# Patient Record
Sex: Female | Born: 1935 | Race: White | Hispanic: No | Marital: Single | Smoking: Never smoker
Health system: Southern US, Community
[De-identification: ages and names within clinical notes are randomized; demographics above are authoritative.]

## PROBLEM LIST (undated history)

## (undated) DIAGNOSIS — K2270 Barrett's esophagus without dysplasia: Secondary | ICD-10-CM

## (undated) DIAGNOSIS — L905 Scar conditions and fibrosis of skin: Secondary | ICD-10-CM

## (undated) DIAGNOSIS — I10 Essential (primary) hypertension: Secondary | ICD-10-CM

## (undated) DIAGNOSIS — K219 Gastro-esophageal reflux disease without esophagitis: Secondary | ICD-10-CM

## (undated) DIAGNOSIS — N200 Calculus of kidney: Secondary | ICD-10-CM

## (undated) DIAGNOSIS — I639 Cerebral infarction, unspecified: Secondary | ICD-10-CM

## (undated) DIAGNOSIS — M1990 Unspecified osteoarthritis, unspecified site: Secondary | ICD-10-CM

## (undated) DIAGNOSIS — D649 Anemia, unspecified: Secondary | ICD-10-CM

## (undated) HISTORY — PX: OTHER SURGICAL HISTORY: SHX169

---

## 2012-10-31 ENCOUNTER — Other Ambulatory Visit (HOSPITAL_COMMUNITY): Payer: Self-pay | Admitting: Orthopaedic Surgery

## 2012-11-04 ENCOUNTER — Other Ambulatory Visit (HOSPITAL_COMMUNITY): Payer: Self-pay | Admitting: Orthopaedic Surgery

## 2012-11-14 ENCOUNTER — Encounter (HOSPITAL_COMMUNITY): Payer: Self-pay | Admitting: Pharmacy Technician

## 2012-11-15 ENCOUNTER — Encounter (HOSPITAL_COMMUNITY)
Admission: RE | Admit: 2012-11-15 | Discharge: 2012-11-15 | Disposition: A | Discharge status: Home / Self Care | Payer: Medicare Other | Source: Ambulatory Visit | Attending: Orthopaedic Surgery | Admitting: Orthopaedic Surgery

## 2012-11-15 ENCOUNTER — Ambulatory Visit (HOSPITAL_COMMUNITY)
Admission: RE | Admit: 2012-11-15 | Discharge: 2012-11-15 | Disposition: A | Discharge status: Home / Self Care | Payer: Medicare Other | Source: Ambulatory Visit | Attending: Orthopaedic Surgery | Admitting: Orthopaedic Surgery

## 2012-11-15 ENCOUNTER — Encounter (HOSPITAL_COMMUNITY): Payer: Self-pay

## 2012-11-15 DIAGNOSIS — Z01812 Encounter for preprocedural laboratory examination: Secondary | ICD-10-CM | POA: Insufficient documentation

## 2012-11-15 DIAGNOSIS — Z01818 Encounter for other preprocedural examination: Secondary | ICD-10-CM | POA: Insufficient documentation

## 2012-11-15 DIAGNOSIS — K449 Diaphragmatic hernia without obstruction or gangrene: Secondary | ICD-10-CM | POA: Insufficient documentation

## 2012-11-15 DIAGNOSIS — S2000XA Contusion of breast, unspecified breast, initial encounter: Secondary | ICD-10-CM | POA: Insufficient documentation

## 2012-11-15 DIAGNOSIS — R0602 Shortness of breath: Secondary | ICD-10-CM | POA: Insufficient documentation

## 2012-11-15 HISTORY — DX: Essential (primary) hypertension: I10

## 2012-11-15 HISTORY — DX: Barrett's esophagus without dysplasia: K22.70

## 2012-11-15 HISTORY — DX: Gastro-esophageal reflux disease without esophagitis: K21.9

## 2012-11-15 HISTORY — DX: Anemia, unspecified: D64.9

## 2012-11-15 HISTORY — DX: Cerebral infarction, unspecified: I63.9

## 2012-11-15 HISTORY — DX: Scar conditions and fibrosis of skin: L90.5

## 2012-11-15 HISTORY — DX: Unspecified osteoarthritis, unspecified site: M19.90

## 2012-11-15 LAB — APTT: aPTT: 29 seconds (ref 24–37)

## 2012-11-15 NOTE — Pre-Procedure Instructions (Signed)
EKG, CMET, CBC, DIFF, URINALYSIS REPORTS ON PT'S CHART FROM DR. MOscar La DONE 11/08/2012.  CXR, PT, PTT, T/S WERE DONE TODAY - PREOP - AT [facility].

## 2012-11-15 NOTE — Patient Instructions (Signed)
YOUR SURGERY IS SCHEDULED AT [facility]  ON:  Friday  4/4  REPORT TO [place] SHORT STAY CENTER AT:  7:30 AM      PHONE # FOR SHORT STAY IS [phone]  DO NOT EAT OR DRINK ANYTHING AFTER MIDNIGHT THE NIGHT BEFORE YOUR SURGERY.  YOU MAY BRUSH YOUR TEETH, RINSE OUT YOUR MOUTH--BUT NO WATER, NO FOOD, NO CHEWING GUM, NO MINTS, NO CANDIES, NO CHEWING TOBACCO.  PLEASE TAKE THE FOLLOWING MEDICATIONS THE AM OF YOUR SURGERY WITH A FEW SIPS OF WATER: AMLODIPINE, LIPITOR, PRILOSEC, AND TAKE PAIN MEDICATION TYLENOL # 3 IF NEEDED FOR PAIN.    DO NOT BRING VALUABLES, MONEY, CREDIT CARDS.  DO NOT WEAR JEWELRY, MAKE-UP, NAIL POLISH AND NO METAL PINS OR CLIPS IN YOUR HAIR. CONTACT LENS, DENTURES / PARTIALS, GLASSES SHOULD NOT BE WORN TO SURGERY AND IN MOST CASES-HEARING AIDS WILL NEED TO BE REMOVED.  BRING YOUR GLASSES CASE, ANY EQUIPMENT NEEDED FOR YOUR CONTACT LENS. FOR PATIENTS ADMITTED TO THE HOSPITAL--CHECK OUT TIME THE DAY OF DISCHARGE IS 11:00 AM.  ALL INPATIENT ROOMS ARE PRIVATE - WITH BATHROOM, TELEPHONE, TELEVISION AND WIFI INTERNET.                                PLEASE READ OVER ANY  FACT SHEETS THAT YOU WERE GIVEN: MRSA INFORMATION, BLOOD TRANSFUSION INFORMATION FAILURE TO FOLLOW THESE INSTRUCTIONS MAY RESULT IN THE CANCELLATION OF YOUR SURGERY.   PATIENT SIGNATURE_________________________________

## 2012-11-25 ENCOUNTER — Inpatient Hospital Stay (HOSPITAL_COMMUNITY): Payer: Medicare Other | Admitting: Anesthesiology

## 2012-11-25 ENCOUNTER — Encounter (HOSPITAL_COMMUNITY): Payer: Self-pay | Admitting: Anesthesiology

## 2012-11-25 ENCOUNTER — Inpatient Hospital Stay (HOSPITAL_COMMUNITY)
Admission: RE | Admit: 2012-11-25 | Discharge: 2012-11-29 | DRG: 470 | Disposition: A | Discharge status: Skilled Nursing Facility | Payer: Medicare Other | Source: Ambulatory Visit | Attending: Orthopaedic Surgery | Admitting: Orthopaedic Surgery

## 2012-11-25 ENCOUNTER — Inpatient Hospital Stay (HOSPITAL_COMMUNITY): Payer: Medicare Other

## 2012-11-25 ENCOUNTER — Encounter (HOSPITAL_COMMUNITY)
Admission: RE | Disposition: A | Discharge status: Skilled Nursing Facility | Payer: Self-pay | Source: Ambulatory Visit | Attending: Orthopaedic Surgery

## 2012-11-25 ENCOUNTER — Encounter (HOSPITAL_COMMUNITY): Payer: Self-pay | Admitting: *Deleted

## 2012-11-25 DIAGNOSIS — D62 Acute posthemorrhagic anemia: Secondary | ICD-10-CM | POA: Diagnosis not present

## 2012-11-25 DIAGNOSIS — M1610 Unilateral primary osteoarthritis, unspecified hip: Principal | ICD-10-CM | POA: Diagnosis present

## 2012-11-25 DIAGNOSIS — K219 Gastro-esophageal reflux disease without esophagitis: Secondary | ICD-10-CM | POA: Diagnosis present

## 2012-11-25 DIAGNOSIS — I10 Essential (primary) hypertension: Secondary | ICD-10-CM | POA: Diagnosis present

## 2012-11-25 DIAGNOSIS — R5381 Other malaise: Secondary | ICD-10-CM | POA: Diagnosis present

## 2012-11-25 DIAGNOSIS — M169 Osteoarthritis of hip, unspecified: Secondary | ICD-10-CM

## 2012-11-25 HISTORY — PX: TOTAL HIP ARTHROPLASTY: SHX124

## 2012-11-25 SURGERY — ARTHROPLASTY, HIP, TOTAL, ANTERIOR APPROACH
Anesthesia: General | Site: Hip | Laterality: Right | Wound class: Clean

## 2012-11-25 MED ORDER — METHOCARBAMOL 500 MG PO TABS
500.0000 mg | ORAL_TABLET | Freq: Four times a day (QID) | ORAL | Status: DC | PRN
  Administered 2012-11-26 – 2012-11-29 (×11): 500 mg via ORAL
  Filled 2012-11-25 (×10): qty 1

## 2012-11-25 MED ORDER — ALUM & MAG HYDROXIDE-SIMETH 200-200-20 MG/5ML PO SUSP: 30.0000 mL | ORAL | Status: DC | PRN

## 2012-11-25 MED ORDER — HYDROMORPHONE HCL PF 1 MG/ML IJ SOLN
INTRAMUSCULAR | Status: DC | PRN
  Administered 2012-11-25 (×2): 1 mg via INTRAVENOUS

## 2012-11-25 MED ORDER — SODIUM CHLORIDE 0.9 % IR SOLN
Status: DC | PRN
  Administered 2012-11-25: 1000 mL

## 2012-11-25 MED ORDER — PROMETHAZINE HCL 25 MG/ML IJ SOLN: 6.2500 mg | INTRAMUSCULAR | Status: DC | PRN

## 2012-11-25 MED ORDER — HYDROCHLOROTHIAZIDE 25 MG PO TABS
25.0000 mg | ORAL_TABLET | Freq: Every day | ORAL | Status: DC
  Administered 2012-11-26 – 2012-11-29 (×3): 25 mg via ORAL
  Filled 2012-11-25 (×4): qty 1

## 2012-11-25 MED ORDER — PROPOFOL 10 MG/ML IV BOLUS
INTRAVENOUS | Status: DC | PRN
  Administered 2012-11-25: 150 mg via INTRAVENOUS

## 2012-11-25 MED ORDER — OXYCODONE HCL ER 10 MG PO T12A
10.0000 mg | EXTENDED_RELEASE_TABLET | Freq: Two times a day (BID) | ORAL | Status: DC
  Administered 2012-11-25 – 2012-11-29 (×8): 10 mg via ORAL
  Filled 2012-11-25 (×8): qty 1

## 2012-11-25 MED ORDER — ACETAMINOPHEN 650 MG RE SUPP: 650.0000 mg | Freq: Four times a day (QID) | RECTAL | Status: DC | PRN

## 2012-11-25 MED ORDER — IRBESARTAN 300 MG PO TABS
300.0000 mg | ORAL_TABLET | Freq: Every day | ORAL | Status: DC
  Administered 2012-11-26 – 2012-11-29 (×3): 300 mg via ORAL
  Filled 2012-11-25 (×4): qty 1

## 2012-11-25 MED ORDER — ONDANSETRON HCL 4 MG/2ML IJ SOLN
4.0000 mg | Freq: Four times a day (QID) | INTRAMUSCULAR | Status: DC | PRN
  Administered 2012-11-25: 4 mg via INTRAVENOUS
  Filled 2012-11-25: qty 2

## 2012-11-25 MED ORDER — PHENOL 1.4 % MT LIQD: 1.0000 | OROMUCOSAL | Status: DC | PRN

## 2012-11-25 MED ORDER — ACETAMINOPHEN 10 MG/ML IV SOLN
INTRAVENOUS | Status: DC | PRN
  Administered 2012-11-25: 1000 mg via INTRAVENOUS

## 2012-11-25 MED ORDER — EZETIMIBE 10 MG PO TABS
10.0000 mg | ORAL_TABLET | Freq: Every day | ORAL | Status: DC
  Administered 2012-11-25 – 2012-11-28 (×4): 10 mg via ORAL
  Filled 2012-11-25 (×5): qty 1

## 2012-11-25 MED ORDER — CLOPIDOGREL BISULFATE 75 MG PO TABS
75.0000 mg | ORAL_TABLET | Freq: Every day | ORAL | Status: DC
  Administered 2012-11-26 – 2012-11-29 (×4): 75 mg via ORAL
  Filled 2012-11-25 (×6): qty 1

## 2012-11-25 MED ORDER — LACTATED RINGERS IV SOLN: INTRAVENOUS | Status: DC

## 2012-11-25 MED ORDER — 0.9 % SODIUM CHLORIDE (POUR BTL) OPTIME
TOPICAL | Status: DC | PRN
  Administered 2012-11-25: 1000 mL

## 2012-11-25 MED ORDER — ACETAMINOPHEN 325 MG PO TABS
650.0000 mg | ORAL_TABLET | Freq: Four times a day (QID) | ORAL | Status: DC | PRN
  Administered 2012-11-26: 650 mg via ORAL
  Filled 2012-11-25 (×2): qty 2

## 2012-11-25 MED ORDER — ASPIRIN EC 325 MG PO TBEC
325.0000 mg | DELAYED_RELEASE_TABLET | Freq: Every day | ORAL | Status: DC
  Administered 2012-11-26 – 2012-11-29 (×4): 325 mg via ORAL
  Filled 2012-11-25 (×5): qty 1

## 2012-11-25 MED ORDER — HYDROMORPHONE HCL PF 1 MG/ML IJ SOLN
0.5000 mg | INTRAMUSCULAR | Status: DC | PRN
  Administered 2012-11-26 (×2): 0.5 mg via INTRAVENOUS
  Filled 2012-11-25 (×2): qty 1

## 2012-11-25 MED ORDER — AMLODIPINE BESYLATE 10 MG PO TABS
10.0000 mg | ORAL_TABLET | Freq: Every day | ORAL | Status: DC
  Administered 2012-11-26 – 2012-11-29 (×3): 10 mg via ORAL
  Filled 2012-11-25 (×5): qty 1

## 2012-11-25 MED ORDER — DIPHENHYDRAMINE HCL 12.5 MG/5ML PO ELIX: 12.5000 mg | ORAL_SOLUTION | ORAL | Status: DC | PRN

## 2012-11-25 MED ORDER — FENTANYL CITRATE 0.05 MG/ML IJ SOLN
25.0000 ug | INTRAMUSCULAR | Status: DC | PRN
  Administered 2012-11-25: 25 ug via INTRAVENOUS

## 2012-11-25 MED ORDER — DOCUSATE SODIUM 100 MG PO CAPS
100.0000 mg | ORAL_CAPSULE | Freq: Two times a day (BID) | ORAL | Status: DC
  Administered 2012-11-26 – 2012-11-29 (×7): 100 mg via ORAL

## 2012-11-25 MED ORDER — METHOCARBAMOL 100 MG/ML IJ SOLN: 500.0000 mg | Freq: Four times a day (QID) | INTRAVENOUS | Status: DC | PRN

## 2012-11-25 MED ORDER — ROCURONIUM BROMIDE 100 MG/10ML IV SOLN
INTRAVENOUS | Status: DC | PRN
  Administered 2012-11-25: 20 mg via INTRAVENOUS

## 2012-11-25 MED ORDER — MENTHOL 3 MG MT LOZG
1.0000 | LOZENGE | OROMUCOSAL | Status: DC | PRN
  Administered 2012-11-26: 3 mg via ORAL
  Filled 2012-11-25: qty 9

## 2012-11-25 MED ORDER — GLYCOPYRROLATE 0.2 MG/ML IJ SOLN
INTRAMUSCULAR | Status: DC | PRN
  Administered 2012-11-25: .8 mg via INTRAVENOUS

## 2012-11-25 MED ORDER — CEFAZOLIN SODIUM-DEXTROSE 2-3 GM-% IV SOLR
2.0000 g | INTRAVENOUS | Status: AC
  Administered 2012-11-25: 2 g via INTRAVENOUS

## 2012-11-25 MED ORDER — FENOFIBRATE 160 MG PO TABS
160.0000 mg | ORAL_TABLET | Freq: Every evening | ORAL | Status: DC
  Administered 2012-11-26 – 2012-11-28 (×3): 160 mg via ORAL
  Filled 2012-11-25 (×5): qty 1

## 2012-11-25 MED ORDER — CEFAZOLIN SODIUM 1-5 GM-% IV SOLN
1.0000 g | Freq: Four times a day (QID) | INTRAVENOUS | Status: AC
  Administered 2012-11-25 – 2012-11-26 (×2): 1 g via INTRAVENOUS
  Filled 2012-11-25 (×2): qty 50

## 2012-11-25 MED ORDER — DEXAMETHASONE SODIUM PHOSPHATE 4 MG/ML IJ SOLN
INTRAMUSCULAR | Status: DC | PRN
  Administered 2012-11-25: 10 mg via INTRAVENOUS

## 2012-11-25 MED ORDER — ONDANSETRON HCL 4 MG PO TABS: 4.0000 mg | ORAL_TABLET | Freq: Four times a day (QID) | ORAL | Status: DC | PRN

## 2012-11-25 MED ORDER — FENTANYL CITRATE 0.05 MG/ML IJ SOLN
INTRAMUSCULAR | Status: DC | PRN
  Administered 2012-11-25 (×2): 50 ug via INTRAVENOUS
  Administered 2012-11-25: 25 ug via INTRAVENOUS
  Administered 2012-11-25 (×2): 50 ug via INTRAVENOUS
  Administered 2012-11-25: 25 ug via INTRAVENOUS
  Administered 2012-11-25: 100 ug via INTRAVENOUS
  Administered 2012-11-25: 50 ug via INTRAVENOUS
  Administered 2012-11-25 (×3): 25 ug via INTRAVENOUS
  Administered 2012-11-25: 75 ug via INTRAVENOUS

## 2012-11-25 MED ORDER — PANTOPRAZOLE SODIUM 40 MG PO TBEC: 40.0000 mg | DELAYED_RELEASE_TABLET | Freq: Every day | ORAL | Status: DC | PRN

## 2012-11-25 MED ORDER — OXYCODONE HCL 5 MG PO TABS
5.0000 mg | ORAL_TABLET | ORAL | Status: DC | PRN
  Administered 2012-11-26 (×2): 10 mg via ORAL
  Administered 2012-11-26: 5 mg via ORAL
  Administered 2012-11-26 – 2012-11-27 (×2): 10 mg via ORAL
  Administered 2012-11-28 – 2012-11-29 (×3): 5 mg via ORAL
  Filled 2012-11-25: qty 1
  Filled 2012-11-25: qty 2
  Filled 2012-11-25 (×2): qty 1
  Filled 2012-11-25 (×3): qty 2
  Filled 2012-11-25: qty 1

## 2012-11-25 MED ORDER — OMEPRAZOLE MAGNESIUM 20 MG PO TBEC: 20.0000 mg | DELAYED_RELEASE_TABLET | ORAL | Status: DC | PRN

## 2012-11-25 MED ORDER — TERAZOSIN HCL 2 MG PO CAPS
2.0000 mg | ORAL_CAPSULE | Freq: Every day | ORAL | Status: DC
  Administered 2012-11-25 – 2012-11-28 (×3): 2 mg via ORAL
  Filled 2012-11-25 (×5): qty 1

## 2012-11-25 MED ORDER — ZOLPIDEM TARTRATE 5 MG PO TABS: 5.0000 mg | ORAL_TABLET | Freq: Every evening | ORAL | Status: DC | PRN

## 2012-11-25 MED ORDER — NEOSTIGMINE METHYLSULFATE 1 MG/ML IJ SOLN
INTRAMUSCULAR | Status: DC | PRN
  Administered 2012-11-25: 5 mg via INTRAVENOUS

## 2012-11-25 MED ORDER — LACTATED RINGERS IV SOLN
INTRAVENOUS | Status: DC
  Administered 2012-11-25 (×2): via INTRAVENOUS
  Administered 2012-11-25: 1000 mL via INTRAVENOUS

## 2012-11-25 MED ORDER — METOCLOPRAMIDE HCL 10 MG PO TABS: 5.0000 mg | ORAL_TABLET | Freq: Three times a day (TID) | ORAL | Status: DC | PRN

## 2012-11-25 MED ORDER — ATORVASTATIN CALCIUM 10 MG PO TABS
10.0000 mg | ORAL_TABLET | Freq: Every day | ORAL | Status: DC
  Administered 2012-11-26 – 2012-11-29 (×4): 10 mg via ORAL
  Filled 2012-11-25 (×5): qty 1

## 2012-11-25 MED ORDER — SODIUM CHLORIDE 0.9 % IV SOLN
INTRAVENOUS | Status: DC
  Administered 2012-11-25 – 2012-11-26 (×2): via INTRAVENOUS

## 2012-11-25 MED ORDER — FERROUS FUMARATE 325 (106 FE) MG PO TABS
1.0000 | ORAL_TABLET | Freq: Every day | ORAL | Status: DC
  Administered 2012-11-26 – 2012-11-29 (×4): 106 mg via ORAL
  Filled 2012-11-25 (×5): qty 1

## 2012-11-25 MED ORDER — METOCLOPRAMIDE HCL 5 MG/ML IJ SOLN: 5.0000 mg | Freq: Three times a day (TID) | INTRAMUSCULAR | Status: DC | PRN

## 2012-11-25 MED ORDER — OLMESARTAN MEDOXOMIL-HCTZ 40-25 MG PO TABS: 1.0000 | ORAL_TABLET | Freq: Every day | ORAL | Status: DC

## 2012-11-25 MED ORDER — METOCLOPRAMIDE HCL 5 MG/ML IJ SOLN
INTRAMUSCULAR | Status: DC | PRN
  Administered 2012-11-25: 10 mg via INTRAVENOUS

## 2012-11-25 MED ORDER — VITAMIN D (ERGOCALCIFEROL) 1.25 MG (50000 UNIT) PO CAPS
50000.0000 [IU] | ORAL_CAPSULE | ORAL | Status: DC
  Administered 2012-11-28: 50000 [IU] via ORAL
  Filled 2012-11-25 (×2): qty 1

## 2012-11-25 SURGICAL SUPPLY — 35 items
BAG ZIPLOCK 12X15 (MISCELLANEOUS) ×2 IMPLANT
BLADE SAW SGTL 18X1.27X75 (BLADE) ×2 IMPLANT
CELLS DAT CNTRL 66122 CELL SVR (MISCELLANEOUS) ×1 IMPLANT
CLOTH BEACON ORANGE TIMEOUT ST (SAFETY) ×2 IMPLANT
DERMABOND ADVANCED (GAUZE/BANDAGES/DRESSINGS) ×1
DERMABOND ADVANCED .7 DNX12 (GAUZE/BANDAGES/DRESSINGS) ×1 IMPLANT
DRAPE C-ARM 42X72 X-RAY (DRAPES) ×2 IMPLANT
DRAPE STERI IOBAN 125X83 (DRAPES) ×2 IMPLANT
DRAPE U-SHAPE 47X51 STRL (DRAPES) ×6 IMPLANT
DRSG AQUACEL AG ADV 3.5X10 (GAUZE/BANDAGES/DRESSINGS) ×2 IMPLANT
DURAPREP 26ML APPLICATOR (WOUND CARE) ×2 IMPLANT
ELECT BLADE TIP CTD 4 INCH (ELECTRODE) ×2 IMPLANT
ELECT REM PT RETURN 9FT ADLT (ELECTROSURGICAL) ×2
ELECTRODE REM PT RTRN 9FT ADLT (ELECTROSURGICAL) ×1 IMPLANT
FACESHIELD LNG OPTICON STERILE (SAFETY) ×8 IMPLANT
GLOVE BIO SURGEON STRL SZ7.5 (GLOVE) ×2 IMPLANT
GLOVE BIOGEL PI IND STRL 8 (GLOVE) ×2 IMPLANT
GLOVE BIOGEL PI INDICATOR 8 (GLOVE) ×2
GLOVE ECLIPSE 8.0 STRL XLNG CF (GLOVE) ×2 IMPLANT
GOWN STRL REIN XL XLG (GOWN DISPOSABLE) ×4 IMPLANT
HANDPIECE INTERPULSE COAX TIP (DISPOSABLE) ×1
KIT BASIN OR (CUSTOM PROCEDURE TRAY) ×2 IMPLANT
PACK TOTAL JOINT (CUSTOM PROCEDURE TRAY) ×2 IMPLANT
PADDING CAST COTTON 6X4 STRL (CAST SUPPLIES) ×2 IMPLANT
RTRCTR WOUND ALEXIS 18CM MED (MISCELLANEOUS) ×2
SET HNDPC FAN SPRY TIP SCT (DISPOSABLE) ×1 IMPLANT
SUT ETHIBOND NAB CT1 #1 30IN (SUTURE) ×2 IMPLANT
SUT MNCRL AB 4-0 PS2 18 (SUTURE) ×2 IMPLANT
SUT NYLON 3 0 (SUTURE) ×2 IMPLANT
SUT VIC AB 1 CT1 36 (SUTURE) ×2 IMPLANT
SUT VIC AB 2-0 CT1 27 (SUTURE) ×1
SUT VIC AB 2-0 CT1 TAPERPNT 27 (SUTURE) ×1 IMPLANT
TOWEL OR 17X26 10 PK STRL BLUE (TOWEL DISPOSABLE) ×2 IMPLANT
TOWEL OR NON WOVEN STRL DISP B (DISPOSABLE) ×2 IMPLANT
TRAY FOLEY CATH 14FRSI W/METER (CATHETERS) ×2 IMPLANT

## 2012-11-25 NOTE — Anesthesia Postprocedure Evaluation (Signed)
Anesthesia Post Note  Patient: Brenda Schmidt  Procedure(s) Performed: Procedure(s) (LRB): RIGHT TOTAL HIP ARTHROPLASTY ANTERIOR APPROACH (Right)  Anesthesia type: General  Patient location: PACU  Post pain: Pain level controlled  Post assessment: Post-op Vital signs reviewed  Last Vitals:  Filed Vitals:   11/25/12 1330  BP: 146/52  Pulse: 50  Temp: 36.8 C  Resp: 13    Post vital signs: Reviewed  Level of consciousness: sedated  Complications: No apparent anesthesia complications

## 2012-11-25 NOTE — Care Management Note (Addendum)
    Page 1 of 1   11/28/2012     1:48:40 PM   CARE MANAGEMENT NOTE 11/28/2012  Patient:  WANNETTA, LANGLAND   Account Number:  [id number]  Date Initiated:  11/25/2012  Documentation initiated by:  Colleen Can  Subjective/Objective Assessment:   DX TOTAL HIP REPLACEMNT RIGHT-ANTERIOR APPROACH     Action/Plan:   SNF REHAB   Anticipated DC Date:  11/28/2012   Anticipated DC Plan:  SKILLED NURSING FACILITY  In-house referral  Clinical Social Worker      DC Planning Services  CM consult      Choice offered to / List presented to:             Status of service:  Completed, signed off Medicare Important Message given?   (If response is "NO", the following Medicare IM given date fields will be blank) Date Medicare IM given:   Date Additional Medicare IM given:    Discharge Disposition:    Per UR Regulation:  Reviewed for med. necessity/level of care/duration of stay  If discussed at Long Length of Stay Meetings, dates discussed:    Comments:
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
## 2012-11-25 NOTE — Progress Notes (Signed)
Clinical Social Work Department BRIEF PSYCHOSOCIAL ASSESSMENT 11/25/2012  Patient:  Brenda Schmidt, Brenda Schmidt     Account Number:  [id number]     Admit date:  11/25/2012  Clinical Social Worker:  Candie Chroman  Date/Time:  11/25/2012 03:49 PM  Referred by:  Physician  Date Referred:  11/25/2012 Referred for  SNF Placement   Other Referral:   Interview type:  Patient Other interview type:    PSYCHOSOCIAL DATA Living Status:  ALONE Admitted from facility:   Level of care:   Primary support name:  Donn Pierini Primary support relationship to patient:  SIBLING Degree of support available:   supportive    CURRENT CONCERNS Current Concerns  Post-Acute Placement   Other Concerns:    SOCIAL WORK ASSESSMENT / PLAN Pt is an 79 yr old female living at home prior to hospitalization. CSW met with pt / family to assist with d/c planning. Pt has made prior arrangements to have ST Rehab at [facility] following hospital d/c. CSW has contacted SNF and d/c plans have been confirmed. CSW will follow to assist with d/c planning to SNF.   Assessment/plan status:  Psychosocial Support/Ongoing Assessment of Needs Other assessment/ plan:   Information/referral to community resources:   None needed at this time.    PATIENT'S/FAMILY'S RESPONSE TO PLAN OF CARE: Pt looking forward to feeling better and having rehab at [facility].   Cori Razor LCSW [phone]

## 2012-11-25 NOTE — Preoperative (Signed)
Beta Blockers   Reason not to administer Beta Blockers:Not Applicable, not on home BB 

## 2012-11-25 NOTE — Progress Notes (Signed)
PT NOTE - POD 0 eval deferred at request of RN - pt too lethargic to participate.  Will follow in am.

## 2012-11-25 NOTE — Progress Notes (Signed)
Utilization review completed.  

## 2012-11-25 NOTE — Anesthesia Preprocedure Evaluation (Signed)
Anesthesia Evaluation  Patient identified by MRN, date of birth, ID band Patient awake    Reviewed: Allergy & Precautions, H&P , NPO status , Patient's Chart, lab work & pertinent test results  Airway Mallampati: II TM Distance: >3 FB Neck ROM: Full    Dental  (+) Edentulous Upper and Edentulous Lower   Pulmonary neg pulmonary ROS,  breath sounds clear to auscultation  Pulmonary exam normal       Cardiovascular hypertension, Pt. on medications negative cardio ROS  Rhythm:Regular Rate:Normal     Neuro/Psych CVA, Residual Symptoms negative neurological ROS  negative psych ROS   GI/Hepatic negative GI ROS, Neg liver ROS, GERD-  ,  Endo/Other  negative endocrine ROS  Renal/GU negative Renal ROS  negative genitourinary   Musculoskeletal negative musculoskeletal ROS (+)   Abdominal   Peds  Hematology negative hematology ROS (+)   Anesthesia Other Findings   Reproductive/Obstetrics                           Anesthesia Physical Anesthesia Plan  ASA: III  Anesthesia Plan: General   Post-op Pain Management:    Induction: Intravenous  Airway Management Planned: Oral ETT  Additional Equipment:   Intra-op Plan:   Post-operative Plan: Extubation in OR  Informed Consent: I have reviewed the patients History and Physical, chart, labs and discussed the procedure including the risks, benefits and alternatives for the proposed anesthesia with the patient or authorized representative who has indicated his/her understanding and acceptance.   Dental advisory given  Plan Discussed with: CRNA  Anesthesia Plan Comments:         Anesthesia Quick Evaluation

## 2012-11-25 NOTE — Progress Notes (Signed)
Clinical Social Work Department CLINICAL SOCIAL WORK PLACEMENT NOTE 11/25/2012  Patient:  Brenda Schmidt, Brenda Schmidt  Account Number:  [id number] Admit date:  11/25/2012  Clinical Social Worker:  Cori Razor, LCSW  Date/time:  11/25/2012 04:07 PM  Clinical Social Work is seeking post-discharge placement for this patient at the following level of care:   SKILLED NURSING   (*CSW will update this form in Epic as items are completed)     Patient/family provided with [facility] Department of Clinical Social Work's list of facilities offering this level of care within the geographic area requested by the patient (or if unable, by the patient's family).  11/25/2012  Patient/family informed of their freedom to choose among providers that offer the needed level of care, that participate in Medicare, Medicaid or managed care program needed by the patient, have an available bed and are willing to accept the patient.    Patient/family informed of [facility]' ownership interest in [facility], as well as of the fact that they are under no obligation to receive care at this facility.  PASARR submitted to EDS on 11/25/2012 PASARR number received from [employer]DS on   FL2 transmitted to all facilities in geographic area requested by pt/family on  11/25/2012 FL2 transmitted to all facilities within larger geographic area on   Patient informed that his/her managed care company has contracts with or will negotiate with  certain facilities, including the following:     Patient/family informed of bed offers received:  11/25/2012 Patient chooses bed at [facility] Physician recommends and patient chooses bed at    Patient to be transferred to [facility] on   Patient to be transferred to facility by   The following physician request were entered in Epic:   Additional Comments:  Cori Razor LCSW [phone]

## 2012-11-25 NOTE — Transfer of Care (Signed)
Immediate Anesthesia Transfer of Care Note  Patient: Brenda Schmidt  Procedure(s) Performed: Procedure(s): RIGHT TOTAL HIP ARTHROPLASTY ANTERIOR APPROACH (Right)  Patient Location: PACU  Anesthesia Type:General  Level of Consciousness: awake, patient cooperative and responds to stimulation, drowsy, disoriented,ventilating well   Airway & Oxygen Therapy: Patient Spontanous Breathing and Patient connected to face mask  Post-op Assessment: Report given to PACU RN, Post -op Vital signs reviewed and stable and Patient moving all extremities  Post vital signs: Reviewed and stable  Complications: No apparent anesthesia complications

## 2012-11-25 NOTE — Brief Op Note (Signed)
11/25/2012  12:09 PM  PATIENT:  Kathalene Frames  79 y.o. female  PRE-OPERATIVE DIAGNOSIS:  Severe osteoarthritis right hip  POST-OPERATIVE DIAGNOSIS:  Severe osteoarthritis right hip  PROCEDURE:  Procedure(s): RIGHT TOTAL HIP ARTHROPLASTY ANTERIOR APPROACH (Right)  SURGEON:  Surgeon(s) and Role:    * Kathryne Hitch, MD - Primary  PHYSICIAN ASSISTANT: [name], PA-C  ANESTHESIA:   general  EBL:  Total I/O In: 2000 [I.V.:2000] Out: 530 [Urine:230; Blood:300]  BLOOD ADMINISTERED:none  DRAINS: none   LOCAL MEDICATIONS USED:  NONE  SPECIMEN:  No Specimen  DISPOSITION OF SPECIMEN:  N/A  COUNTS:  YES  TOURNIQUET:  * No tourniquets in log *  DICTATION: .Other Dictation: Dictation Number [id number]  PLAN OF CARE: Admit to inpatient   PATIENT DISPOSITION:  PACU - hemodynamically stable.   Delay start of Pharmacological VTE agent (>24hrs) due to surgical blood loss or risk of bleeding: no

## 2012-11-26 LAB — BASIC METABOLIC PANEL
CO2: 23 mEq/L (ref 19–32)
Chloride: 105 mEq/L (ref 96–112)
Glucose, Bld: 137 mg/dL — ABNORMAL HIGH (ref 70–99)
Sodium: 135 mEq/L (ref 135–145)

## 2012-11-26 LAB — CBC
Hemoglobin: 8 g/dL — ABNORMAL LOW (ref 12.0–15.0)
MCH: 28 pg (ref 26.0–34.0)
Platelets: 282 10*3/uL (ref 150–400)
RBC: 2.86 MIL/uL — ABNORMAL LOW (ref 3.87–5.11)
WBC: 9.9 10*3/uL (ref 4.0–10.5)

## 2012-11-26 NOTE — Evaluation (Signed)
Physical Therapy Evaluation Patient Details Name: Brenda Schmidt MRN: [id number] DOB: 02-05-1929 Today's Date: 11/26/2012 Time: 4098-1191 PT Time Calculation (min): 44 min  PT Assessment / Plan / Recommendation Clinical Impression  Pt s/p R THR presents with decreased R LE strength/ROM, post op pain and SOB with exertion limiting functional mobility    PT Assessment  Patient needs continued PT services    Follow Up Recommendations  SNF    Does the patient have the potential to tolerate intense rehabilitation      Barriers to Discharge Decreased caregiver support      Equipment Recommendations  None recommended by PT    Recommendations for Other Services OT consult   Frequency 7X/week    Precautions / Restrictions Precautions Precautions: Fall;Other (comment) (SOB with exertion) Restrictions Weight Bearing Restrictions: No Other Position/Activity Restrictions: WBAT   Pertinent Vitals/Pain 3/10 at end of session; premedicated, ice packs provided      Mobility  Bed Mobility Bed Mobility: Supine to Sit Supine to Sit: 1: +2 Total assist Supine to Sit: Patient Percentage: 40% Details for Bed Mobility Assistance: cues for use of L LE and UEs to self assist.  Pt assisted to side of bed using pad Transfers Transfers: Sit to Stand;Stand to Sit Sit to Stand: 1: +2 Total assist;With upper extremity assist;From bed;From elevated surface Sit to Stand: Patient Percentage: 60% Stand to Sit: 1: +2 Total assist;With armrests;With upper extremity assist;To chair/3-in-1 Stand to Sit: Patient Percentage: 60% Details for Transfer Assistance: Cues for LE management and use of UEs Ambulation/Gait Ambulation/Gait Assistance: 1: +2 Total assist Ambulation/Gait: Patient Percentage: 70% Ambulation Distance (Feet): 12 Feet Assistive device: Rolling walker Ambulation/Gait Assistance Details: cues for sequence, posture, position from RW and to decrease BOS Gait Pattern: Step-to  pattern;Decreased step length - right;Decreased step length - left;Wide base of support;Trunk flexed Gait velocity: slow General Gait Details: Pt ltd by SOB with exertion and requiring multiple short rests to complete tasks    Exercises Total Joint Exercises Ankle Circles/Pumps: AROM;15 reps;Supine;Both Heel Slides: AAROM;15 reps;Supine;Right Hip ABduction/ADduction: AAROM;15 reps;Supine;Right   PT Diagnosis: Difficulty walking  PT Problem List: Decreased strength;Decreased range of motion;Decreased activity tolerance;Decreased mobility;Decreased balance;Decreased knowledge of use of DME;Pain;Obesity PT Treatment Interventions: DME instruction;Gait training;Stair training;Functional mobility training;Therapeutic activities;Therapeutic exercise   PT Goals Acute Rehab PT Goals PT Goal Formulation: With patient Time For Goal Achievement: 12/02/12 Potential to Achieve Goals: Good Pt will go Supine/Side to Sit: with supervision PT Goal: Supine/Side to Sit - Progress: Goal set today Pt will go Sit to Supine/Side: with supervision PT Goal: Sit to Supine/Side - Progress: Goal set today Pt will go Sit to Stand: with supervision PT Goal: Sit to Stand - Progress: Goal set today Pt will go Stand to Sit: with supervision PT Goal: Stand to Sit - Progress: Goal set today Pt will Ambulate: 51 - 150 feet;with supervision;with rolling walker PT Goal: Ambulate - Progress: Goal set today  Visit Information  Last PT Received On: 11/26/12 Assistance Needed: +2    Subjective Data  Subjective: It just hurts so bad when I try to move it myself Patient Stated Goal: Rehab and then home alone to resume previous lifestyle with decreased pain   Prior Functioning  Home Living Lives With: Alone Prior Function Level of Independence: Independent with assistive device(s) Able to Take Stairs?: Yes Driving: Yes 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[employer] Dominant Hand: Right    Cognition   Cognition Overall Cognitive Status: Appears within functional limits for tasks assessed/performed Arousal/Alertness:  Awake/alert Orientation Level: Appears intact for tasks assessed Behavior During Session: [facility] for tasks performed Cognition - Other Comments: Increased anxiety level    Extremity/Trunk Assessment Right Upper Extremity Assessment RUE ROM/Strength/Tone: WFL for tasks assessed Left Upper Extremity Assessment LUE ROM/Strength/Tone: WFL for tasks assessed Right Lower Extremity Assessment RLE ROM/Strength/Tone: Deficits RLE ROM/Strength/Tone Deficits: Hip strength 2/5 with AAROM to 75 hip flex and 10 abd Left Lower Extremity Assessment LLE ROM/Strength/Tone: WFL for tasks assessed   Balance    End of Session PT - End of Session Equipment Utilized During Treatment: Gait belt Activity Tolerance: Patient limited by fatigue;Patient limited by pain Patient left: in chair;with call bell/phone within reach;with family/visitor present Nurse Communication: Mobility status  GP     Zaina Jenkin 11/26/2012, 1:16 PM

## 2012-11-26 NOTE — Op Note (Signed)
Brenda Schmidt, [name]               ACCOUNT NO.:  [id number]  MEDICAL RECORD NO.:  [id number]  LOCATION:  1617                         FACILITY:  [facility]  PHYSICIAN:  Vanita Panda. Magnus Ivan, M.D.DATE OF BIRTH:  1929-08-17  DATE OF PROCEDURE:  11/25/2012 DATE OF DISCHARGE:                              OPERATIVE REPORT   PREOPERATIVE DIAGNOSIS:  Right hip severe end-stage arthritis and degenerative joint disease.  POSTOPERATIVE DIAGNOSIS:  Right hip severe end-stage arthritis and degenerative joint disease.  PROCEDURE:  Right total hip arthroplasty through direct anterior approach.  IMPLANTS:  DePuy Sector acetabular component size 48, size 32+4 neutral polyethylene liner, size 10 Corail femoral component with standard offset, size 32+5 metal hip ball.  SURGEON:  Vanita Panda. Magnus Ivan, M.D.  ASSISTANT:  Richardean Canal, P.A.  ANESTHESIA:  General.  ANTIBIOTICS:  2 g IV Ancef.  BLOOD LOSS:  Less than 500 mL.  COMPLICATIONS:  None.  INDICATIONS:  Ms. Brenda Schmidt is a very pleasant 79 year old female with severe end-stage arthritis involving the right hip.  She has actually had collapse of the hip joint and I felt that she may have had a hip fracture at some point.  It has gotten to where the mobility is severely limited and her pain is daily and limiting her quality of life.  She wished to proceed with a total hip arthroplasty.  DESCRIPTION OF PROCEDURE:  After informed consent was obtained, appropriate right hip was marked.  She was brought to the operating room.  General anesthesia was obtained while she was on a stretcher.  A Foley catheter was placed and then traction boots were placed on both her feet.  She was then placed supine on the Hana fracture table with the perineal post in place and both legs in inline skeletal traction but no traction applied.  The right operative hip was then prepped and draped with DuraPrep and sterile drapes.  Time-out was called to identify  correct patient and correct right hip.  We then made an incision just inferior and posterior to the anterior superior iliac spine and carried this obliquely down the leg.  I dissected down to the tensor fascia lata.  The tensor fascia was divided longitudinally.  We then proceeded with a direct anterior approach to the hip.  A Cobra retractor was placed around the lateral neck and up underneath the rectus femoris.  A medial retractor was placed.  We cauterized the lateral femoral circumflex vessels and then divided the hip capsule in the L-type format.  Large effusion was encountered and __________ collapse the femoral head.  We used an oscillating saw to make my femoral neck cut proximal to the lesser trochanter and completed this with osteotome.  We then removed the femoral head in several parts.  We cleaned the acetabular debris and then made a femoral neck cut a little bit lower down.  We then placed a bent Hohmann medially and a Cobra retractor laterally.  I began reaming from size 42 reamer only up to a size 48 with all reamers placed under direct fluoroscopic guidance and all reamers were placed under direct visualization and then the last reamer was also placed under direct fluoroscopy, so we  could obtain depth of reaming by __________ anteversion.  We then placed the real size 48 DePuy Gription acetabular component and an apex hole eliminator guide as well as the real 32+4 neutral polyethylene liner.  We were pleased with the positioning of acetabular component.  We then externally rotated the right femur to 90 degrees, extended and adducted it.  We placed a __________ retractor medially and bent Hohmann behind the greater trochanter.  I released the lateral hip capsule.  I then used a box cutting guide to open the femoral canal and a rongeur to lateralize it.  We began broaching from the size 8 and broached up to a size 10.  I felt like we __________ up to a size 11 broach, but I  was concerned about the leg lengths __________ obvious discrepancy and we will need to make it longer for stability.  I trialed a standard neck and a 32+1 hip ball, and she was just a little bit short, so we then dislocated the hip and removed the trial components.  We placed the real Corail 10 femoral component with standard offset and the real 32+5 metal hip ball.  I reduced it back and the acetabulum was stable and the __________ on direct fluoroscopy.  I then copiously irrigated the soft tissues with normal saline solution using pulsatile lavage.  We closed the joint capsule with interrupted #1 Ethibond suture followed by running #1 Vicryl in the tensor fascia, 0 Vicryl in the deep tissue, 2-0 Vicryl in subcutaneous tissue, and Monocryl subcuticular stitch, and Dermabond on the skin.  A well-padded dressing was applied.  She was taken off the Hana table, awakened, extubated, and taken to recovery room in stable condition.  All final counts were correct and there were no complications noted.  Of note, [name], [facility] was present during the entire case.  His presence was definitely crucial for exposure of the hip joint, retracting, positioning, and closure of the wound.     Vanita Panda. Magnus Ivan, M.D.     CYB/MEDQ  D:  11/25/2012  T:  11/25/2012  Job:  [id number]

## 2012-11-26 NOTE — Progress Notes (Signed)
Subjective: 1 Day Post-Op Procedure(s) (LRB): RIGHT TOTAL HIP ARTHROPLASTY ANTERIOR APPROACH (Right) Patient reports pain as moderate.  Acute blood loss anemia.  Objective: Vital signs in last 24 hours: Temp:  [97.9 F (36.6 C)-98.8 F (37.1 C)] 98.6 F (37 C) (04/05 0920) Pulse Rate:  [41-84] 70 (04/05 0920) Resp:  [10-18] 16 (04/05 0920) BP: (110-163)/(52-74) 135/68 mmHg (04/05 0920) SpO2:  [89 %-100 %] 94 % (04/05 0920) Weight:  [88.905 kg (196 lb)] 88.905 kg (196 lb) (04/04 1400)  Intake/Output from previous day: 04/04 0701 - 04/05 0700 In: 4136.3 [P.O.:840; I.V.:3296.3] Out: 995 [Urine:695; Blood:300] Intake/Output this shift: Total I/O In: 240 [P.O.:240] Out: -    Recent Labs  11/26/12 0434  HGB 8.0*    Recent Labs  11/26/12 0434  WBC 9.9  RBC 2.86*  HCT 25.3*  PLT 282    Recent Labs  11/26/12 0434  NA 135  K 4.5  CL 105  CO2 23  BUN 20  CREATININE 0.96  GLUCOSE 137*  CALCIUM 9.0   No results found for this basename: LABPT, INR,  in the last 72 hours  Sensation intact distally Intact pulses distally Dorsiflexion/Plantar flexion intact Incision: dressing C/D/I  Assessment/Plan: 1 Day Post-Op Procedure(s) (LRB): RIGHT TOTAL HIP ARTHROPLASTY ANTERIOR APPROACH (Right) Up with therapy Follow hgb closely.  Karmelo Bass Y 11/26/2012, 9:52 AM

## 2012-11-26 NOTE — Progress Notes (Signed)
Physical Therapy Treatment Patient Details Name: Brenda Schmidt MRN: [id number] DOB: [birth year] Today's Date: 11/26/2012 Time: 5284-1324 PT Time Calculation (min): 20 min  PT Assessment / Plan / Recommendation Comments on Treatment Session       Follow Up Recommendations  SNF     Does the patient have the potential to tolerate intense rehabilitation     Barriers to Discharge        Equipment Recommendations  None recommended by PT    Recommendations for Other Services OT consult  Frequency 7X/week   Plan Discharge plan remains appropriate    Precautions / Restrictions Precautions Precautions: Fall;Other (comment) Restrictions Weight Bearing Restrictions: No Other Position/Activity Restrictions: WBAT   Pertinent Vitals/Pain     Mobility  Bed Mobility Bed Mobility: Sit to Supine Sit to Supine: 1: +2 Total assist Sit to Supine: Patient Percentage: 40% Details for Bed Mobility Assistance: cues for use of L LE and UEs to self assist.  Pt assisted to side of bed using pad Transfers Transfers: Sit to Stand;Stand to Sit Sit to Stand: 1: +2 Total assist;With upper extremity assist;From bed;From elevated surface Sit to Stand: Patient Percentage: 60% Stand to Sit: 1: +2 Total assist;With armrests;With upper extremity assist;To chair/3-in-1 Stand to Sit: Patient Percentage: 60% Details for Transfer Assistance: Cues for LE management and use of UEs Ambulation/Gait Ambulation/Gait Assistance: 1: +2 Total assist Ambulation/Gait: Patient Percentage: 70% Ambulation Distance (Feet): 5 Feet Assistive device: Rolling walker Ambulation/Gait Assistance Details: cues for sequence, pace, posture, position from RW Gait Pattern: Step-to pattern;Decreased step length - right;Decreased step length - left;Wide base of support;Trunk flexed Gait velocity: slow General Gait Details: Pt ltd by SOB with exertion and requiring multiple short rests to complete tasks    Exercises     PT Diagnosis:     PT Problem List:   PT Treatment Interventions:     PT Goals Acute Rehab PT Goals PT Goal Formulation: With patient Time For Goal Achievement: 12/02/12 Potential to Achieve Goals: Good Pt will go Supine/Side to Sit: with supervision PT Goal: Supine/Side to Sit - Progress: Goal set today Pt will go Sit to Supine/Side: with supervision PT Goal: Sit to Supine/Side - Progress: Goal set today Pt will go Sit to Stand: with supervision PT Goal: Sit to Stand - Progress: Goal set today Pt will go Stand to Sit: with supervision PT Goal: Stand to Sit - Progress: Goal set today Pt will Ambulate: 51 - 150 feet;with supervision;with rolling walker PT Goal: Ambulate - Progress: Goal set today  Visit Information  Last PT Received On: 11/26/12 Assistance Needed: +2    Subjective Data  Subjective: I think I'm ready to lay down Patient Stated Goal: Rehab and then home alone to resume previous lifestyle with decreased pain   Cognition  Cognition Overall Cognitive Status: Appears within functional limits for tasks assessed/performed Arousal/Alertness: Awake/alert Orientation Level: Appears intact for tasks assessed Behavior During Session: [facility] for tasks performed Cognition - Other Comments: Increased anxiety level    Balance     End of Session PT - End of Session Equipment Utilized During Treatment: Gait belt Activity Tolerance: Patient limited by fatigue;Patient limited by pain Patient left: in bed;with call bell/phone within reach Nurse Communication: Mobility status   GP     Authur Cubit 11/26/2012, 4:27 PM

## 2012-11-26 NOTE — Progress Notes (Signed)
OT Cancellation Note  Patient Details Name: Brenda Schmidt MRN: [id number] DOB: 1929/01/31   Cancelled Treatment:    Reason Eval/Treat Not Completed: Met with patient and educated her is the scope of OT.  Pt with plans for rehab in SNF.  ADL education is best deferred to SNF.  Signing off.  Evern Bio 11/26/2012, 2:40 PM [phone]

## 2012-11-27 LAB — CBC
HCT: 23.8 % — ABNORMAL LOW (ref 36.0–46.0)
Hemoglobin: 7.5 g/dL — ABNORMAL LOW (ref 12.0–15.0)
MCH: 27.8 pg (ref 26.0–34.0)
MCV: 88.1 fL (ref 78.0–100.0)
Platelets: 319 10*3/uL (ref 150–400)
RBC: 2.7 MIL/uL — ABNORMAL LOW (ref 3.87–5.11)
WBC: 9.3 10*3/uL (ref 4.0–10.5)

## 2012-11-27 NOTE — Progress Notes (Signed)
Physical Therapy Treatment Patient Details Name: Brenda Schmidt MRN: [id number] DOB: 02-18-1929 Today's Date: 11/27/2012 Time: 4098-1191 PT Time Calculation (min): 27 min  PT Assessment / Plan / Recommendation Comments on Treatment Session  Continued increase time all tasks 2* SOB with exertion    Follow Up Recommendations  SNF     Does the patient have the potential to tolerate intense rehabilitation     Barriers to Discharge        Equipment Recommendations  None recommended by PT    Recommendations for Other Services OT consult  Frequency 7X/week   Plan Discharge plan remains appropriate    Precautions / Restrictions Precautions Precautions: Fall;Other (comment) (SOB with exertion) Restrictions Weight Bearing Restrictions: No Other Position/Activity Restrictions: WBAT   Pertinent Vitals/Pain     Mobility  Bed Mobility Bed Mobility: Sit to Supine Supine to Sit: 1: +2 Total assist Sit to Supine: 1: +2 Total assist Sit to Supine: Patient Percentage: 60% Transfers Transfers: Sit to Stand;Stand to Sit Sit to Stand: 4: Min assist;3: Mod assist;With armrests;With upper extremity assist;From chair/3-in-1 Stand to Sit: 4: Min assist;With upper extremity assist;To bed;To chair/3-in-1;With armrests;3: Mod assist Details for Transfer Assistance: Cues for LE management and use of UEs Ambulation/Gait Ambulation/Gait Assistance: 1: +2 Total assist Ambulation/Gait: Patient Percentage: 70% Ambulation Distance (Feet): 14 Feet (and 4) Assistive device: Rolling walker Ambulation/Gait Assistance Details: cues for sequence, posture, stride length, position from RW: physical assist for support, balance, RW management and intermittantly to advance R LE Gait Pattern: Step-to pattern;Decreased step length - right;Decreased step length - left;Wide base of support;Trunk flexed Gait velocity: slow General Gait Details: Multiple short rests required 2* SOB; pt with difficulty clearing floor to  advance R LE     Exercises     PT Diagnosis:    PT Problem List:   PT Treatment Interventions:     PT Goals Acute Rehab PT Goals PT Goal Formulation: With patient Time For Goal Achievement: 12/02/12 Potential to Achieve Goals: Good Pt will go Supine/Side to Sit: with supervision PT Goal: Supine/Side to Sit - Progress: Progressing toward goal Pt will go Sit to Supine/Side: with supervision PT Goal: Sit to Supine/Side - Progress: Progressing toward goal Pt will go Sit to Stand: with supervision PT Goal: Sit to Stand - Progress: Progressing toward goal Pt will go Stand to Sit: with supervision PT Goal: Stand to Sit - Progress: Progressing toward goal Pt will Ambulate: 51 - 150 feet;with supervision;with rolling walker PT Goal: Ambulate - Progress: Progressing toward goal  Visit Information  Last PT Received On: 11/27/12 Assistance Needed: +2    Subjective Data  Subjective: I'm ready to lay down but I need to use the bathroom Patient Stated Goal: Rehab and then home alone to resume previous lifestyle with decreased pain   Cognition  Cognition Arousal/Alertness: Awake/alert Orientation Level: Appears intact for tasks assessed Behavior During Session: [facility] for tasks performed Cognition - Other Comments: Increased anxiety level    Balance     End of Session PT - End of Session Equipment Utilized During Treatment: Gait belt Activity Tolerance: Patient tolerated treatment well Patient left: in bed;with call bell/phone within reach Nurse Communication: Mobility status   GP     Brenda Schmidt 11/27/2012, 3:41 PM

## 2012-11-27 NOTE — Progress Notes (Signed)
Physical Therapy Treatment Patient Details Name: Brenda Schmidt MRN: [id number] DOB: Mar 13, 1929 Today's Date: 11/27/2012 Time: 4098-1191 PT Time Calculation (min): 42 min  PT Assessment / Plan / Recommendation Comments on Treatment Session  Improvement in activity tolerance from yesterday but pt continues ltd by SOB wtih exertion.    Follow Up Recommendations  SNF     Does the patient have the potential to tolerate intense rehabilitation     Barriers to Discharge        Equipment Recommendations  None recommended by PT    Recommendations for Other Services OT consult  Frequency 7X/week   Plan Discharge plan remains appropriate    Precautions / Restrictions Precautions Precautions: Fall;Other (comment) Restrictions Weight Bearing Restrictions: No Other Position/Activity Restrictions: WBAT   Pertinent Vitals/Pain 3/10; premed, ice packs provided    Mobility  Bed Mobility Bed Mobility: Supine to Sit Supine to Sit: 1: +2 Total assist Supine to Sit: Patient Percentage: 50% Details for Bed Mobility Assistance: cues for use of L LE and UEs to self assist.  Pt assisted to side of bed using pad Transfers Transfers: Sit to Stand;Stand to Sit Sit to Stand: 1: +2 Total assist;From bed;From chair/3-in-1;With armrests;With upper extremity assist Sit to Stand: Patient Percentage: 70% Stand to Sit: 1: +2 Total assist;With armrests;With upper extremity assist;To chair/3-in-1;To bed Stand to Sit: Patient Percentage: 70% Details for Transfer Assistance: Cues for LE management and use of UEs Ambulation/Gait Ambulation/Gait Assistance: 1: +2 Total assist Ambulation/Gait: Patient Percentage: 70% Ambulation Distance (Feet): 18 Feet (and 2 x 3') Assistive device: Rolling walker Ambulation/Gait Assistance Details: cues for posture, sequence, stride length, position from RW  and increased DF on R Gait Pattern: Step-to pattern;Decreased step length - right;Decreased step length - left;Wide base  of support;Trunk flexed Gait velocity: slow General Gait Details: Multiple short rests required 2* SOB; pt with difficulty clearing floor to advance R LE     Exercises Total Joint Exercises Ankle Circles/Pumps: AROM;15 reps;Supine;Both Quad Sets: AROM;10 reps;Supine;Both Gluteal Sets: AROM;10 reps;Both;Supine Heel Slides: AAROM;15 reps;Supine;Right Hip ABduction/ADduction: AAROM;15 reps;Supine;Right   PT Diagnosis:    PT Problem List:   PT Treatment Interventions:     PT Goals Acute Rehab PT Goals PT Goal Formulation: With patient Time For Goal Achievement: 12/02/12 Potential to Achieve Goals: Good Pt will go Supine/Side to Sit: with supervision PT Goal: Supine/Side to Sit - Progress: Progressing toward goal Pt will go Sit to Supine/Side: with supervision PT Goal: Sit to Supine/Side - Progress: Progressing toward goal Pt will go Sit to Stand: with supervision PT Goal: Sit to Stand - Progress: Progressing toward goal Pt will go Stand to Sit: with supervision PT Goal: Stand to Sit - Progress: Progressing toward goal Pt will Ambulate: 51 - 150 feet;with supervision;with rolling walker PT Goal: Ambulate - Progress: Progressing toward goal  Visit Information  Last PT Received On: 11/27/12 Assistance Needed: +2    Subjective Data  Subjective: Its feeling better than yesterday Patient Stated Goal: Rehab and then home alone to resume previous lifestyle with decreased pain   Cognition  Cognition Overall Cognitive Status: Appears within functional limits for tasks assessed/performed Arousal/Alertness: Awake/alert Orientation Level: Appears intact for tasks assessed Behavior During Session: [facility] for tasks performed Cognition - Other Comments: Increased anxiety level    Balance     End of Session PT - End of Session Equipment Utilized During Treatment: Gait belt Activity Tolerance: Patient tolerated treatment well Patient left: in chair;with call bell/phone within reach Nurse  Communication:  Mobility status   GP     Myna Freimark 11/27/2012, 12:19 PM

## 2012-11-27 NOTE — Progress Notes (Signed)
Subjective: Pt stable - amb in hall   Objective: Vital signs in last 24 hours: Temp:  [99.1 F (37.3 C)-99.6 F (37.6 C)] 99.2 F (37.3 C) (04/06 1239) Pulse Rate:  [67-74] 74 (04/06 1239) Resp:  [16-20] 20 (04/06 1239) BP: (95-138)/(51-65) 130/51 mmHg (04/06 1239) SpO2:  [93 %-95 %] 95 % (04/06 1239)  Intake/Output from previous day: 04/05 0701 - 04/06 0700 In: 1481.8 [P.O.:240; I.V.:1241.8] Out: 1225 [Urine:1225] Intake/Output this shift:    Exam:  Neurovascular intact Sensation intact distally Intact pulses distally Dorsiflexion/Plantar flexion intact  Labs:  Recent Labs  11/26/12 0434 11/27/12 0449  HGB 8.0* 7.5*    Recent Labs  11/26/12 0434 11/27/12 0449  WBC 9.9 9.3  RBC 2.86* 2.70*  HCT 25.3* 23.8*  PLT 282 319    Recent Labs  11/26/12 0434  NA 135  K 4.5  CL 105  CO2 23  BUN 20  CREATININE 0.96  GLUCOSE 137*  CALCIUM 9.0   No results found for this basename: LABPT, INR,  in the last 72 hours  Assessment/Plan: Pt doing well - mobilize with PT - dc this week   Brenda Schmidt 11/27/2012, 2:04 PM

## 2012-11-28 ENCOUNTER — Encounter (HOSPITAL_COMMUNITY): Payer: Self-pay | Admitting: Orthopaedic Surgery

## 2012-11-28 LAB — CBC
HCT: 23.8 % — ABNORMAL LOW (ref 36.0–46.0)
Hemoglobin: 7.5 g/dL — ABNORMAL LOW (ref 12.0–15.0)
MCH: 27.7 pg (ref 26.0–34.0)
MCHC: 31.5 g/dL (ref 30.0–36.0)
MCV: 87.8 fL (ref 78.0–100.0)
RDW: 14.2 % (ref 11.5–15.5)

## 2012-11-28 LAB — PREPARE RBC (CROSSMATCH)

## 2012-11-28 MED ORDER — ACETAMINOPHEN 325 MG PO TABS
650.0000 mg | ORAL_TABLET | Freq: Once | ORAL | Status: AC
Start: 2012-11-28 — End: 2012-11-28
  Administered 2012-11-28: 650 mg via ORAL

## 2012-11-28 NOTE — Progress Notes (Signed)
Subjective: 3 Days Post-Op Procedure(s) (LRB): RIGHT TOTAL HIP ARTHROPLASTY ANTERIOR APPROACH (Right) Patient reports pain as moderate.  Receiving blood due to symptomatic acute blood loss anemia.  Objective: Vital signs in last 24 hours: Temp:  [97.5 F (36.4 C)-99.9 F (37.7 C)] 97.5 F (36.4 C) (04/07 0717) Pulse Rate:  [57-79] 58 (04/07 0717) Resp:  [16-20] 16 (04/07 0717) BP: (127-157)/(51-76) 128/76 mmHg (04/07 0717) SpO2:  [94 %-97 %] 97 % (04/06 2102)  Intake/Output from previous day: 04/06 0701 - 04/07 0700 In: 776.3 [P.O.:480; I.V.:283.8; Blood:12.5] Out: 1700 [Urine:1700] Intake/Output this shift:     Recent Labs  11/26/12 0434 11/27/12 0449 11/28/12 0441  HGB 8.0* 7.5* 7.5*    Recent Labs  11/27/12 0449 11/28/12 0441  WBC 9.3 8.5  RBC 2.70* 2.71*  HCT 23.8* 23.8*  PLT 319 329    Recent Labs  11/26/12 0434  NA 135  K 4.5  CL 105  CO2 23  BUN 20  CREATININE 0.96  GLUCOSE 137*  CALCIUM 9.0   No results found for this basename: LABPT, INR,  in the last 72 hours  Sensation intact distally Dorsiflexion/Plantar flexion intact Incision: dressing C/D/I  Assessment/Plan: 3 Days Post-Op Procedure(s) (LRB): RIGHT TOTAL HIP ARTHROPLASTY ANTERIOR APPROACH (Right) Plan for discharge tomorrow to SNF  [facility] 11/28/2012, 7:48 AM

## 2012-11-28 NOTE — Progress Notes (Signed)
Physical Therapy Treatment Patient Details Name: Brenda Schmidt MRN: [id number] DOB: Sep 11, 1928 Today's Date: 11/28/2012 Time: 4098-1191 PT Time Calculation (min): 43 min  PT Assessment / Plan / Recommendation Comments on Treatment Session  POD # 3 R THR Dir Ant.  Am session C/M blood transfusion.  PM session assisted pt OOB to amb limited distance then performed TE's while in recliner.  Pt progressing slowly and plans to D/C to [facility] for ST Rehab.    Follow Up Recommendations  SNF     Does the patient have the potential to tolerate intense rehabilitation     Barriers to Discharge        Equipment Recommendations  None recommended by PT    Recommendations for Other Services    Frequency 7X/week   Plan Discharge plan remains appropriate    Precautions / Restrictions Precautions Precautions: Fall Restrictions Weight Bearing Restrictions: No RLE Weight Bearing: Weight bearing as tolerated   Pertinent Vitals/Pain C/o 8/10 R hip pain during gait C/o 4/10 R hip pain during TE's ICE applied    Mobility  Bed Mobility Bed Mobility: Supine to Sit Supine to Sit: 2: Max assist Details for Bed Mobility Assistance: Max assist to support R LE off bed and increased time with use of rail Transfers Transfers: Sit to Stand;Stand to Sit Sit to Stand: 4: Min assist;3: Mod assist;With armrests;With upper extremity assist;From chair/3-in-1 Stand to Sit: 4: Min assist;With upper extremity assist;To bed;To chair/3-in-1;With armrests;3: Mod assist Details for Transfer Assistance: 50% Vc's on proper tech and hand placement Ambulation/Gait Ambulation/Gait Assistance: 1: +2 Total assist Ambulation/Gait: Patient Percentage: 70% Ambulation Distance (Feet): 16 Feet Assistive device: Rolling walker Ambulation/Gait Assistance Details: 75% VC's on proper sequencing, proper walker to self distance and min assist to advance R LE along with 75% VC's to increase R knee flex and heel strike as pt was  "toe scrunching" to advance R LE.  Gait Pattern: Step-to pattern;Decreased step length - right;Decreased step length - left;Wide base of support;Trunk flexed Gait velocity: slow    Exercises Total Joint Exercises Ankle Circles/Pumps: AROM;Both;10 reps;Seated Quad Sets: AROM;Right;10 reps;Seated Short Arc Quad: AROM;Right;10 reps;Seated Heel Slides: AAROM;Right;10 reps;Supine Hip ABduction/ADduction: AAROM;Right;10 reps;Supine    PT Goals                                                            progressing    Visit Information  Last PT Received On: 11/28/12 Assistance Needed: +2    Subjective Data      Cognition    fair+   Balance   poor  End of Session PT - End of Session Equipment Utilized During Treatment: Gait belt Activity Tolerance: Patient tolerated treatment well Patient left: in chair;with call bell/phone within reach;with family/visitor present   Felecia Shelling  PTA [facility]  Acute  Rehab Pager      [phone]

## 2012-11-29 LAB — TYPE AND SCREEN
ABO/RH(D): O POS
Unit division: 0

## 2012-11-29 MED ORDER — HYDROCODONE-ACETAMINOPHEN 5-325 MG PO TABS: 1.0000 | ORAL_TABLET | ORAL | Status: DC | PRN

## 2012-11-29 NOTE — Progress Notes (Signed)
Patient ID: Brenda Schmidt, female   DOB: 1929-08-16, 79 y.o.   MRN: [id number] Looks good.  Vitals stable.  Can discharge to SNF today.

## 2012-11-29 NOTE — Progress Notes (Signed)
Clinical Social Work Department CLINICAL SOCIAL WORK PLACEMENT NOTE 11/29/2012  Patient:  Brenda Schmidt, Brenda Schmidt  Account Number:  [id number] Admit date:  11/25/2012  Clinical Social Worker:  Cori Razor, LCSW  Date/time:  11/25/2012 04:07 PM  Clinical Social Work is seeking post-discharge placement for this patient at the following level of care:   SKILLED NURSING   (*CSW will update this form in Epic as items are completed)     Patient/family provided with [facility] Department of Clinical Social Work's list of facilities offering this level of care within the geographic area requested by the patient (or if unable, by the patient's family).  11/25/2012  Patient/family informed of their freedom to choose among providers that offer the needed level of care, that participate in Medicare, Medicaid or managed care program needed by the patient, have an available bed and are willing to accept the patient.    Patient/family informed of [facility]' ownership interest in [facility], as well as of the fact that they are under no obligation to receive care at this facility.  PASARR submitted to EDS on 11/25/2012 PASARR number received from EDS on   FL2 transmitted to all facilities in geographic area requested by pt/family on  11/25/2012 FL2 transmitted to all facilities within larger geographic area on   Patient informed that his/her managed care company has contracts with or will negotiate with  certain facilities, including the following:     Patient/family informed of bed offers received:  11/25/2012 Patient chooses bed at [facility] Physician recommends and patient chooses bed at    Patient to be transferred to [facility] on  11/29/2012 Patient to be transferred to facility by [facility]  The following physician request were entered in Epic:   Additional Comments:  Cori Razor LCSW [phone]

## 2012-11-29 NOTE — Progress Notes (Signed)
Physical Therapy Treatment Patient Details Name: Brenda Schmidt MRN: [id number] DOB: December 20, 1928 Today's Date: 11/29/2012 Time: 7829-5621 PT Time Calculation (min): 35 min  PT Assessment / Plan / Recommendation Comments on Treatment Session  POD # 4 R Dir Ant THR planning to D/C today to SNF.  Assisted pt out of recliner to amb limited distance then perform TE's.     Follow Up Recommendations  SNF     Does the patient have the potential to tolerate intense rehabilitation     Barriers to Discharge        Equipment Recommendations  None recommended by PT    Recommendations for Other Services    Frequency     Plan Discharge plan remains appropriate    Precautions / Restrictions Precautions Precautions: Fall Restrictions Weight Bearing Restrictions: No RLE Weight Bearing: Weight bearing as tolerated   Pertinent Vitals/Pain C/o MAX muscle spasm/cramp during end of TE's/RN notified/repositioned with ICE    Mobility  Bed Mobility Bed Mobility: Not assessed Details for Bed Mobility Assistance: Pt OOB in recliner Transfers Transfers: Sit to Stand;Stand to Sit Sit to Stand: 4: Min assist;3: Mod assist;With armrests;With upper extremity assist;From chair/3-in-1 Stand to Sit: 4: Min assist;With upper extremity assist;To bed;To chair/3-in-1;With armrests;3: Mod assist Details for Transfer Assistance: 50% Vc's on proper tech and hand placement plus increased time Ambulation/Gait Ambulation/Gait Assistance: 1: +2 Total assist Ambulation/Gait: Patient Percentage: 80% Ambulation Distance (Feet): 24 Feet Assistive device: Rolling walker Ambulation/Gait Assistance Details: 50% VC's on proper sequencing and R LE placement while increasing R knee flex and heel stike.  Gait Pattern: Step-to pattern;Decreased step length - right;Decreased step length - left;Wide base of support;Trunk flexed Gait velocity: slow    Exercises   Total Hip Replacement TE's 10 reps ankle pumps 10 reps knee  presses 10 reps heel slides 10 reps SAQ's 10 reps ABD Followed by ICE   PT Goals                                                     progressing    Visit Information  Last PT Received On: 11/29/12 Assistance Needed: +2    Subjective Data      Cognition    good   Balance   fair-  End of Session PT - End of Session Equipment Utilized During Treatment: Gait belt Activity Tolerance: Patient tolerated treatment well Patient left: in chair;with call bell/phone within reach;with family/visitor present   Felecia Shelling  PTA [facility]  Acute  Rehab Pager      [phone]
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
## 2012-11-30 ENCOUNTER — Non-Acute Institutional Stay (SKILLED_NURSING_FACILITY): Payer: Medicare Other | Admitting: Adult Health

## 2012-11-30 DIAGNOSIS — E785 Hyperlipidemia, unspecified: Secondary | ICD-10-CM

## 2012-11-30 DIAGNOSIS — M1990 Unspecified osteoarthritis, unspecified site: Secondary | ICD-10-CM

## 2012-11-30 DIAGNOSIS — K219 Gastro-esophageal reflux disease without esophagitis: Secondary | ICD-10-CM

## 2012-11-30 DIAGNOSIS — M129 Arthropathy, unspecified: Secondary | ICD-10-CM

## 2012-11-30 DIAGNOSIS — D649 Anemia, unspecified: Secondary | ICD-10-CM

## 2012-11-30 DIAGNOSIS — M62838 Other muscle spasm: Secondary | ICD-10-CM

## 2012-11-30 DIAGNOSIS — I10 Essential (primary) hypertension: Secondary | ICD-10-CM

## 2012-12-08 ENCOUNTER — Encounter: Payer: Self-pay | Admitting: Adult Health

## 2012-12-08 DIAGNOSIS — M62838 Other muscle spasm: Secondary | ICD-10-CM | POA: Insufficient documentation

## 2012-12-08 DIAGNOSIS — D649 Anemia, unspecified: Secondary | ICD-10-CM | POA: Insufficient documentation

## 2012-12-08 DIAGNOSIS — E785 Hyperlipidemia, unspecified: Secondary | ICD-10-CM | POA: Insufficient documentation

## 2012-12-08 DIAGNOSIS — D638 Anemia in other chronic diseases classified elsewhere: Secondary | ICD-10-CM | POA: Insufficient documentation

## 2012-12-08 DIAGNOSIS — I10 Essential (primary) hypertension: Secondary | ICD-10-CM | POA: Insufficient documentation

## 2012-12-08 DIAGNOSIS — K219 Gastro-esophageal reflux disease without esophagitis: Secondary | ICD-10-CM | POA: Insufficient documentation

## 2012-12-08 NOTE — Progress Notes (Signed)
  Subjective:    Patient ID: Brenda Schmidt, female    DOB: 03/22/1929, 79 y.o.   MRN: [id number]  HPI This is an 79 year old female who complains of right lower extremity spasm. She has been admitted to [facility] on 11/29/12 from [facility] with end-stage arthritis S/P right total hip arthroplasty. She has been admitted for a short-term rehabilitation.   Review of Systems  Constitutional: Negative.   HENT: Negative.   Eyes: Negative.   Respiratory: Negative for chest tightness and shortness of breath.   Cardiovascular: Positive for leg swelling. Negative for chest pain.  Gastrointestinal: Negative.   Endocrine: Negative.   Genitourinary: Negative.   Neurological: Negative for dizziness and weakness.  Hematological: Negative for adenopathy. Does not bruise/bleed easily.  Psychiatric/Behavioral: Negative.        Objective:   Physical Exam  Constitutional: She is oriented to person, place, and time. She appears well-developed and well-nourished.  HENT:  Head: Normocephalic and atraumatic.  Right Ear: External ear normal.  Left Ear: External ear normal.  Eyes: Conjunctivae are normal. Pupils are equal, round, and reactive to light.  Neck: Normal range of motion. Neck supple. No thyromegaly present.  Cardiovascular: Normal rate.   Pulmonary/Chest: Effort normal and breath sounds normal.  Abdominal: Soft. Bowel sounds are normal.  Musculoskeletal: She exhibits edema and tenderness.  RLE edema, 2+  Neurological: She is alert and oriented to person, place, and time.  Skin: Skin is warm and dry.  Psychiatric: She has a normal mood and affect. Her behavior is normal. Judgment and thought content normal.          Assessment & Plan:   Degenerative arthritis of hip S/P Right total Hip Arthroplasty - for OT and PT  Muscle spasm -  Start Robaxin 500 mg 1 tab PO Q 6 hours PRN  Essential hypertension, benign - discontinue Benicar HCT and start Losartan/HCT 100/25 mg 1 tab  PO Q D per pharmacy therapeutic interchange  Anemia - stable  GERD (gastroesophageal reflux disease) - stable  Other and unspecified hyperlipidemia-  stable

## 2012-12-21 ENCOUNTER — Non-Acute Institutional Stay (SKILLED_NURSING_FACILITY): Payer: Medicare Other | Admitting: Adult Health

## 2012-12-21 DIAGNOSIS — K219 Gastro-esophageal reflux disease without esophagitis: Secondary | ICD-10-CM

## 2012-12-21 DIAGNOSIS — D649 Anemia, unspecified: Secondary | ICD-10-CM

## 2012-12-21 DIAGNOSIS — E785 Hyperlipidemia, unspecified: Secondary | ICD-10-CM

## 2012-12-21 DIAGNOSIS — M62838 Other muscle spasm: Secondary | ICD-10-CM

## 2012-12-21 DIAGNOSIS — M169 Osteoarthritis of hip, unspecified: Secondary | ICD-10-CM

## 2012-12-21 DIAGNOSIS — I10 Essential (primary) hypertension: Secondary | ICD-10-CM

## 2012-12-22 DIAGNOSIS — M1990 Unspecified osteoarthritis, unspecified site: Secondary | ICD-10-CM

## 2012-12-22 DIAGNOSIS — R5383 Other fatigue: Secondary | ICD-10-CM

## 2012-12-22 DIAGNOSIS — R5381 Other malaise: Secondary | ICD-10-CM

## 2012-12-22 DIAGNOSIS — I10 Essential (primary) hypertension: Secondary | ICD-10-CM

## 2012-12-22 DIAGNOSIS — R269 Unspecified abnormalities of gait and mobility: Secondary | ICD-10-CM

## 2012-12-23 NOTE — Progress Notes (Signed)
  Subjective:    Patient ID: Brenda Schmidt, female    DOB: 02-Aug-1929, 79 y.o.   MRN: [id number]  HPI  This is an 79 year old female who is for discharge home with Home health PT and OT. She has been admitted to has been admitted to [facility] on 11/29/12 from [facility] with end-stage arthritis S/P right total hip arthroplasty. She has been admitted for a short-term rehabilitation.   Review of Systems  Constitutional: Negative.   HENT: Negative.   Eyes: Negative.   Respiratory: Negative for chest tightness and shortness of breath.   Cardiovascular: Positive for leg swelling. Negative for chest pain.  Gastrointestinal: Negative.   Endocrine: Negative.   Genitourinary: Negative.   Neurological: Negative for dizziness and weakness.  Hematological: Negative for adenopathy. Does not bruise/bleed easily.  Psychiatric/Behavioral: Negative.    Medications reviewed per [facility]    Objective:   Physical Exam  Nursing note and vitals reviewed. Constitutional: She is oriented to person, place, and time. She appears well-developed and well-nourished.  HENT:  Head: Normocephalic and atraumatic.  Right Ear: External ear normal.  Left Ear: External ear normal.  Eyes: Conjunctivae are normal. Pupils are equal, round, and reactive to light.  Neck: Normal range of motion. Neck supple. No thyromegaly present.  Cardiovascular: Normal rate.   Pulmonary/Chest: Effort normal and breath sounds normal.  Abdominal: Soft. Bowel sounds are normal.  Musculoskeletal: She exhibits edema. She exhibits no tenderness.  RLE edema, 2+  Neurological: She is alert and oriented to person, place, and time.  Skin: Skin is warm and dry.  Psychiatric: She has a normal mood and affect. Her behavior is normal. Judgment and thought content normal.          Assessment & Plan:   Degenerative arthritis of hip S/P Right total Hip Arthroplasty - for Home health OT and PT  Muscle spasm -  Stable  Essential  hypertension, benign - well-controlled  Anemia - stable  GERD (gastroesophageal reflux disease) - stable  Other and unspecified hyperlipidemia-  stable

## 2013-08-20 ENCOUNTER — Emergency Department (HOSPITAL_BASED_OUTPATIENT_CLINIC_OR_DEPARTMENT_OTHER)
Admission: EM | Admit: 2013-08-20 | Discharge: 2013-08-20 | Disposition: A | Discharge status: Home / Self Care | Payer: Medicare Other | Attending: Emergency Medicine | Admitting: Emergency Medicine

## 2013-08-20 ENCOUNTER — Emergency Department (HOSPITAL_BASED_OUTPATIENT_CLINIC_OR_DEPARTMENT_OTHER): Payer: Medicare Other

## 2013-08-20 ENCOUNTER — Encounter (HOSPITAL_BASED_OUTPATIENT_CLINIC_OR_DEPARTMENT_OTHER): Payer: Self-pay | Admitting: Emergency Medicine

## 2013-08-20 DIAGNOSIS — Z7902 Long term (current) use of antithrombotics/antiplatelets: Secondary | ICD-10-CM | POA: Insufficient documentation

## 2013-08-20 DIAGNOSIS — K2270 Barrett's esophagus without dysplasia: Secondary | ICD-10-CM | POA: Insufficient documentation

## 2013-08-20 DIAGNOSIS — Z9889 Other specified postprocedural states: Secondary | ICD-10-CM | POA: Insufficient documentation

## 2013-08-20 DIAGNOSIS — M545 Low back pain, unspecified: Secondary | ICD-10-CM | POA: Insufficient documentation

## 2013-08-20 DIAGNOSIS — Z872 Personal history of diseases of the skin and subcutaneous tissue: Secondary | ICD-10-CM | POA: Insufficient documentation

## 2013-08-20 DIAGNOSIS — D509 Iron deficiency anemia, unspecified: Secondary | ICD-10-CM | POA: Insufficient documentation

## 2013-08-20 DIAGNOSIS — N200 Calculus of kidney: Secondary | ICD-10-CM

## 2013-08-20 DIAGNOSIS — Z87828 Personal history of other (healed) physical injury and trauma: Secondary | ICD-10-CM | POA: Insufficient documentation

## 2013-08-20 DIAGNOSIS — I10 Essential (primary) hypertension: Secondary | ICD-10-CM | POA: Insufficient documentation

## 2013-08-20 DIAGNOSIS — Z8673 Personal history of transient ischemic attack (TIA), and cerebral infarction without residual deficits: Secondary | ICD-10-CM | POA: Insufficient documentation

## 2013-08-20 DIAGNOSIS — Z79899 Other long term (current) drug therapy: Secondary | ICD-10-CM | POA: Insufficient documentation

## 2013-08-20 DIAGNOSIS — M129 Arthropathy, unspecified: Secondary | ICD-10-CM | POA: Insufficient documentation

## 2013-08-20 DIAGNOSIS — Z8739 Personal history of other diseases of the musculoskeletal system and connective tissue: Secondary | ICD-10-CM | POA: Insufficient documentation

## 2013-08-20 HISTORY — DX: Calculus of kidney: N20.0

## 2013-08-20 LAB — CBC WITH DIFFERENTIAL/PLATELET
Basophils Absolute: 0 10*3/uL (ref 0.0–0.1)
Basophils Relative: 0 % (ref 0–1)
HCT: 32.7 % — ABNORMAL LOW (ref 36.0–46.0)
Hemoglobin: 9.5 g/dL — ABNORMAL LOW (ref 12.0–15.0)
Lymphocytes Relative: 6 % — ABNORMAL LOW (ref 12–46)
Lymphs Abs: 0.9 10*3/uL (ref 0.7–4.0)
MCV: 76.4 fL — ABNORMAL LOW (ref 78.0–100.0)
Monocytes Relative: 5 % (ref 3–12)
Neutro Abs: 13.3 10*3/uL — ABNORMAL HIGH (ref 1.7–7.7)
RDW: 23.5 % — ABNORMAL HIGH (ref 11.5–15.5)
WBC: 15.2 10*3/uL — ABNORMAL HIGH (ref 4.0–10.5)

## 2013-08-20 LAB — COMPREHENSIVE METABOLIC PANEL
Alkaline Phosphatase: 121 U/L — ABNORMAL HIGH (ref 39–117)
BUN: 27 mg/dL — ABNORMAL HIGH (ref 6–23)
CO2: 21 mEq/L (ref 19–32)
Chloride: 106 mEq/L (ref 96–112)
Creatinine, Ser: 1.1 mg/dL (ref 0.50–1.10)
GFR calc Af Amer: 53 mL/min — ABNORMAL LOW (ref >90)
GFR calc non Af Amer: 45 mL/min — ABNORMAL LOW (ref >90)
Glucose, Bld: 174 mg/dL — ABNORMAL HIGH (ref 70–99)
Potassium: 4.3 mEq/L (ref 3.5–5.1)
Total Bilirubin: 0.3 mg/dL (ref 0.3–1.2)

## 2013-08-20 LAB — URINALYSIS, ROUTINE W REFLEX MICROSCOPIC
Glucose, UA: NEGATIVE mg/dL
Ketones, ur: NEGATIVE mg/dL
Leukocytes, UA: NEGATIVE
Nitrite: NEGATIVE
Protein, ur: NEGATIVE mg/dL
Urobilinogen, UA: 0.2 mg/dL (ref 0.0–1.0)

## 2013-08-20 LAB — LIPASE, BLOOD: Lipase: 65 U/L — ABNORMAL HIGH (ref 11–59)

## 2013-08-20 LAB — URINE MICROSCOPIC-ADD ON

## 2013-08-20 MED ORDER — ONDANSETRON 8 MG PO TBDP: 8.0000 mg | ORAL_TABLET | Freq: Three times a day (TID) | ORAL | Status: DC | PRN

## 2013-08-20 MED ORDER — OXYCODONE-ACETAMINOPHEN 5-325 MG PO TABS: 2.0000 | ORAL_TABLET | ORAL | Status: DC | PRN

## 2013-08-20 MED ORDER — HYDROMORPHONE HCL PF 1 MG/ML IJ SOLN: 1.0000 mg | Freq: Once | INTRAMUSCULAR | Status: DC

## 2013-08-20 MED ORDER — CEPHALEXIN 250 MG PO CAPS
500.0000 mg | ORAL_CAPSULE | Freq: Once | ORAL | Status: AC
  Administered 2013-08-20: 500 mg via ORAL
  Filled 2013-08-20: qty 2

## 2013-08-20 MED ORDER — HYDROMORPHONE HCL PF 1 MG/ML IJ SOLN
0.5000 mg | Freq: Once | INTRAMUSCULAR | Status: AC
  Administered 2013-08-20: 0.5 mg via INTRAVENOUS
  Filled 2013-08-20: qty 1

## 2013-08-20 MED ORDER — HYDROMORPHONE HCL PF 1 MG/ML IJ SOLN
INTRAMUSCULAR | Status: AC
  Filled 2013-08-20: qty 1

## 2013-08-20 MED ORDER — SODIUM CHLORIDE 0.9 % IV BOLUS (SEPSIS)
1000.0000 mL | Freq: Once | INTRAVENOUS | Status: AC
  Administered 2013-08-20: 1000 mL via INTRAVENOUS

## 2013-08-20 MED ORDER — HYDROMORPHONE HCL PF 1 MG/ML IJ SOLN
0.5000 mg | Freq: Once | INTRAMUSCULAR | Status: AC
  Administered 2013-08-20: 0.5 mg via INTRAVENOUS

## 2013-08-20 MED ORDER — ONDANSETRON HCL 4 MG/2ML IJ SOLN
4.0000 mg | Freq: Once | INTRAMUSCULAR | Status: AC
  Administered 2013-08-20: 4 mg via INTRAVENOUS
  Filled 2013-08-20: qty 2

## 2013-08-20 MED ORDER — CEPHALEXIN 250 MG PO CAPS: 250.0000 mg | ORAL_CAPSULE | Freq: Four times a day (QID) | ORAL | Status: DC

## 2013-08-20 MED ORDER — OXYCODONE-ACETAMINOPHEN 5-325 MG PO TABS
1.0000 | ORAL_TABLET | Freq: Once | ORAL | Status: AC
  Administered 2013-08-20: 1 via ORAL
  Filled 2013-08-20: qty 1

## 2013-08-20 NOTE — ED Notes (Signed)
Family Member reports patients pain has increased. MD aware. Orders pending.

## 2013-08-20 NOTE — ED Notes (Addendum)
Pt complains of left flank pain that started this am around 0330.  Pt complains of nausea vomiting.

## 2013-08-20 NOTE — ED Notes (Signed)
MD at bedside. 
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
## 2013-08-21 ENCOUNTER — Ambulatory Visit (HOSPITAL_COMMUNITY)
Admission: RE | Admit: 2013-08-21 | Discharge: 2013-08-21 | Disposition: A | Discharge status: Home / Self Care | Payer: Medicare Other | Source: Ambulatory Visit | Attending: Urology | Admitting: Urology

## 2013-08-21 ENCOUNTER — Ambulatory Visit (HOSPITAL_COMMUNITY): Payer: Medicare Other | Admitting: Anesthesiology

## 2013-08-21 ENCOUNTER — Encounter (HOSPITAL_COMMUNITY): Payer: Self-pay | Admitting: General Practice

## 2013-08-21 ENCOUNTER — Encounter (HOSPITAL_COMMUNITY): Payer: Medicare Other | Admitting: Anesthesiology

## 2013-08-21 ENCOUNTER — Other Ambulatory Visit: Payer: Self-pay | Admitting: Urology

## 2013-08-21 ENCOUNTER — Encounter (HOSPITAL_COMMUNITY)
Admission: RE | Disposition: A | Discharge status: Home / Self Care | Payer: Self-pay | Source: Ambulatory Visit | Attending: Urology

## 2013-08-21 DIAGNOSIS — E785 Hyperlipidemia, unspecified: Secondary | ICD-10-CM | POA: Insufficient documentation

## 2013-08-21 DIAGNOSIS — Z7982 Long term (current) use of aspirin: Secondary | ICD-10-CM | POA: Insufficient documentation

## 2013-08-21 DIAGNOSIS — K219 Gastro-esophageal reflux disease without esophagitis: Secondary | ICD-10-CM | POA: Insufficient documentation

## 2013-08-21 DIAGNOSIS — I10 Essential (primary) hypertension: Secondary | ICD-10-CM | POA: Insufficient documentation

## 2013-08-21 DIAGNOSIS — Z8673 Personal history of transient ischemic attack (TIA), and cerebral infarction without residual deficits: Secondary | ICD-10-CM | POA: Insufficient documentation

## 2013-08-21 DIAGNOSIS — N201 Calculus of ureter: Secondary | ICD-10-CM | POA: Insufficient documentation

## 2013-08-21 DIAGNOSIS — Z79899 Other long term (current) drug therapy: Secondary | ICD-10-CM | POA: Insufficient documentation

## 2013-08-21 DIAGNOSIS — Z96649 Presence of unspecified artificial hip joint: Secondary | ICD-10-CM | POA: Insufficient documentation

## 2013-08-21 HISTORY — PX: HOLMIUM LASER APPLICATION: SHX5852

## 2013-08-21 HISTORY — PX: CYSTOSCOPY WITH RETROGRADE PYELOGRAM, URETEROSCOPY AND STENT PLACEMENT: SHX5789

## 2013-08-21 LAB — URINE CULTURE: Colony Count: 8000

## 2013-08-21 SURGERY — CYSTOURETEROSCOPY, WITH RETROGRADE PYELOGRAM AND STENT INSERTION
Anesthesia: General | Site: Ureter | Laterality: Left

## 2013-08-21 MED ORDER — OXYCODONE HCL 5 MG/5ML PO SOLN
5.0000 mg | Freq: Once | ORAL | Status: DC | PRN
  Filled 2013-08-21: qty 5

## 2013-08-21 MED ORDER — MEPERIDINE HCL 50 MG/ML IJ SOLN: 6.2500 mg | INTRAMUSCULAR | Status: DC | PRN

## 2013-08-21 MED ORDER — PHENAZOPYRIDINE HCL 200 MG PO TABS: 200.0000 mg | ORAL_TABLET | Freq: Three times a day (TID) | ORAL | Status: DC | PRN

## 2013-08-21 MED ORDER — FENTANYL CITRATE 0.05 MG/ML IJ SOLN
INTRAMUSCULAR | Status: AC
  Filled 2013-08-21: qty 2

## 2013-08-21 MED ORDER — HYDROMORPHONE HCL PF 1 MG/ML IJ SOLN: 0.2500 mg | INTRAMUSCULAR | Status: DC | PRN

## 2013-08-21 MED ORDER — 0.9 % SODIUM CHLORIDE (POUR BTL) OPTIME
TOPICAL | Status: DC | PRN
  Administered 2013-08-21: 1000 mL

## 2013-08-21 MED ORDER — DEXAMETHASONE SODIUM PHOSPHATE 10 MG/ML IJ SOLN
INTRAMUSCULAR | Status: AC
  Filled 2013-08-21: qty 1

## 2013-08-21 MED ORDER — CIPROFLOXACIN IN D5W 400 MG/200ML IV SOLN
INTRAVENOUS | Status: AC
  Filled 2013-08-21: qty 200

## 2013-08-21 MED ORDER — FENTANYL CITRATE 0.05 MG/ML IJ SOLN
INTRAMUSCULAR | Status: DC | PRN
  Administered 2013-08-21 (×4): 25 ug via INTRAVENOUS

## 2013-08-21 MED ORDER — PROPOFOL 10 MG/ML IV BOLUS
INTRAVENOUS | Status: DC | PRN
  Administered 2013-08-21: 150 mg via INTRAVENOUS

## 2013-08-21 MED ORDER — LACTATED RINGERS IV SOLN
INTRAVENOUS | Status: DC | PRN
  Administered 2013-08-21: 20:00:00 via INTRAVENOUS

## 2013-08-21 MED ORDER — PROMETHAZINE HCL 25 MG/ML IJ SOLN: 6.2500 mg | INTRAMUSCULAR | Status: DC | PRN

## 2013-08-21 MED ORDER — IOHEXOL 300 MG/ML  SOLN
INTRAMUSCULAR | Status: DC | PRN
  Administered 2013-08-21: 10 mL

## 2013-08-21 MED ORDER — BELLADONNA ALKALOIDS-OPIUM 16.2-60 MG RE SUPP
RECTAL | Status: AC
  Filled 2013-08-21: qty 1

## 2013-08-21 MED ORDER — LIDOCAINE HCL 2 % EX GEL
CUTANEOUS | Status: AC
  Filled 2013-08-21: qty 10

## 2013-08-21 MED ORDER — DEXAMETHASONE SODIUM PHOSPHATE 10 MG/ML IJ SOLN
INTRAMUSCULAR | Status: DC | PRN
  Administered 2013-08-21: 10 mg via INTRAVENOUS

## 2013-08-21 MED ORDER — SODIUM CHLORIDE 0.9 % IR SOLN
Status: DC | PRN
  Administered 2013-08-21: 3000 mL

## 2013-08-21 MED ORDER — LACTATED RINGERS IV SOLN: INTRAVENOUS | Status: DC

## 2013-08-21 MED ORDER — ONDANSETRON HCL 4 MG/2ML IJ SOLN
INTRAMUSCULAR | Status: AC
  Filled 2013-08-21: qty 2

## 2013-08-21 MED ORDER — PROPOFOL 10 MG/ML IV BOLUS
INTRAVENOUS | Status: AC
  Filled 2013-08-21: qty 20

## 2013-08-21 MED ORDER — CIPROFLOXACIN IN D5W 400 MG/200ML IV SOLN
400.0000 mg | INTRAVENOUS | Status: AC
  Administered 2013-08-21: 400 mg via INTRAVENOUS

## 2013-08-21 MED ORDER — ONDANSETRON HCL 4 MG/2ML IJ SOLN
INTRAMUSCULAR | Status: DC | PRN
  Administered 2013-08-21: 4 mg via INTRAVENOUS

## 2013-08-21 MED ORDER — OXYCODONE HCL 5 MG PO TABS: 5.0000 mg | ORAL_TABLET | Freq: Once | ORAL | Status: DC | PRN

## 2013-08-21 SURGICAL SUPPLY — 18 items
BAG URO CATCHER STRL LF (DRAPE) ×2 IMPLANT
BASKET ZERO TIP NITINOL 2.4FR (BASKET) ×2 IMPLANT
CATH URET 5FR 28IN OPEN ENDED (CATHETERS) IMPLANT
DRAPE CAMERA CLOSED 9X96 (DRAPES) ×2 IMPLANT
FIBER LASER FLEXIVA 200 (UROLOGICAL SUPPLIES) ×2 IMPLANT
GLOVE BIOGEL PI IND STRL 7.5 (GLOVE) ×1 IMPLANT
GLOVE BIOGEL PI INDICATOR 7.5 (GLOVE) ×1
GLOVE SURG SS PI 7.5 STRL IVOR (GLOVE) ×2 IMPLANT
GLOVE SURG SS PI 8.0 STRL IVOR (GLOVE) IMPLANT
GOWN STRL REIN XL XLG (GOWN DISPOSABLE) ×4 IMPLANT
GUIDEWIRE STR DUAL SENSOR (WIRE) ×2 IMPLANT
IV NS IRRIG 3000ML ARTHROMATIC (IV SOLUTION) ×4 IMPLANT
MANIFOLD NEPTUNE II (INSTRUMENTS) ×2 IMPLANT
NS IRRIG 1000ML POUR BTL (IV SOLUTION) ×2 IMPLANT
PACK CYSTO (CUSTOM PROCEDURE TRAY) ×2 IMPLANT
SHEATH ACCESS URETERAL 38CM (SHEATH) ×2 IMPLANT
STENT CONTOUR 6FRX24X.038 (STENTS) ×2 IMPLANT
TUBING CONNECTING 10 (TUBING) ×2 IMPLANT

## 2013-08-21 NOTE — Progress Notes (Signed)
PACU Nsg Note: Pt fully alert and oriented x 4, denies any pain, VSS, MAE x 4, able to ambulate w/ min assistance, gait very steady.  DC instructions reviewed w/ pt and brother at bedside, via teach back method and via handout of DC written instructions. Pt teaching done re: (1) importance of completing abx (2) safety while taking pain med at home (3) post procedural diet (4) importance of PO fluid intake, (5) importance of handwashing (6) making follow up MD appointment, (7) when to call MD, i.e. Fever, uncontrolled pain, etc  Copy of DC instructions given to brother, again opportunity for questions provided. Pt assisted with getting dressed by female family member, pt assisted to w/c and pt with family escorted to exit and pt assisted into private vehicle, again family and patient asked if any further questions they may have had.   M.K. Georgeanna Lea, RN, BSN, CPAN, CCRN
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
## 2013-08-21 NOTE — Transfer of Care (Signed)
Immediate Anesthesia Transfer of Care Note  Patient: Brenda Schmidt  Procedure(s) Performed: Procedure(s): CYSTOSCOPY WITH RETROGRADE PYELOGRAM, URETEROSCOPY AND STENT PLACEMENT (Left) HOLMIUM LASER APPLICATION (Left)  Patient Location: PACU  Anesthesia Type:General  Level of Consciousness: awake, alert , oriented and patient cooperative  Airway & Oxygen Therapy: Patient Spontanous Breathing and Patient connected to face mask oxygen  Post-op Assessment: Report given to PACU RN and Post -op Vital signs reviewed and stable  Post vital signs: Reviewed and stable  Complications: No apparent anesthesia complications

## 2013-08-21 NOTE — Anesthesia Postprocedure Evaluation (Signed)
Anesthesia Post Note  Patient: Brenda Schmidt  Procedure(s) Performed: Procedure(s) (LRB): CYSTOSCOPY WITH RETROGRADE PYELOGRAM, URETEROSCOPY AND STENT PLACEMENT (Left) HOLMIUM LASER APPLICATION (Left)  Anesthesia type: General  Patient location: PACU  Post pain: Pain level controlled  Post assessment: Post-op Vital signs reviewed  Last Vitals: BP 167/61  Pulse 60  Temp(Src) 36.9 C (Oral)  Resp 14  Ht 5\' 6"  (1.676 m)  Wt 194 lb 12.8 oz (88.361 kg)  BMI 31.46 kg/m2  SpO2 95%  Post vital signs: Reviewed  Level of consciousness: sedated  Complications: No apparent anesthesia complications

## 2013-08-21 NOTE — Brief Op Note (Signed)
08/21/2013  9:21 PM  PATIENT:  Kathalene Frames  79 y.o. female  PRE-OPERATIVE DIAGNOSIS:  left ureteral stone  POST-OPERATIVE DIAGNOSIS:  left proximal ureteral stone  PROCEDURE:  Procedure(s): CYSTOSCOPY WITH RETROGRADE PYELOGRAM, URETEROSCOPY AND STENT PLACEMENT (Left) HOLMIUM LASER APPLICATION (Left)  SURGEON:  Surgeon(s) and Role:    * Bjorn Pippin, MD - Primary  PHYSICIAN ASSISTANT:   ASSISTANTS: none   ANESTHESIA:   general  EBL:  Total I/O In: 400 [I.V.:400] Out: -   BLOOD ADMINISTERED:none  DRAINS: left 6 x 24 JJ stent   LOCAL MEDICATIONS USED:  NONE  SPECIMEN:  Source of Specimen:  stone fragments  DISPOSITION OF SPECIMEN:  to family to bring to the office  COUNTS:  YES  TOURNIQUET:  * No tourniquets in log *  DICTATION: .Other Dictation: Dictation Number [id number]  PLAN OF CARE: Discharge to home after PACU  PATIENT DISPOSITION:  PACU - hemodynamically stable.   Delay start of Pharmacological VTE agent (>24hrs) due to surgical blood loss or risk of bleeding: not applicable

## 2013-08-21 NOTE — Anesthesia Preprocedure Evaluation (Signed)
Anesthesia Evaluation  Patient identified by MRN, date of birth, ID band Patient awake    Reviewed: Allergy & Precautions, H&P , NPO status , Patient's Chart, lab work & pertinent test results  Airway Mallampati: II TM Distance: >3 FB Neck ROM: Full    Dental  (+) Edentulous Upper, Edentulous Lower and Dental Advisory Given   Pulmonary neg pulmonary ROS,  breath sounds clear to auscultation  Pulmonary exam normal       Cardiovascular hypertension, Pt. on medications Rhythm:Regular Rate:Normal     Neuro/Psych CVA, Residual Symptoms negative psych ROS   GI/Hepatic negative GI ROS, Neg liver ROS, GERD-  Medicated,  Endo/Other  negative endocrine ROS  Renal/GU Renal disease     Musculoskeletal negative musculoskeletal ROS (+)   Abdominal   Peds  Hematology negative hematology ROS (+) anemia ,   Anesthesia Other Findings   Reproductive/Obstetrics                           Anesthesia Physical  Anesthesia Plan  ASA: III  Anesthesia Plan: General   Post-op Pain Management:    Induction: Intravenous  Airway Management Planned: LMA  Additional Equipment:   Intra-op Plan:   Post-operative Plan: Extubation in OR  Informed Consent: I have reviewed the patients History and Physical, chart, labs and discussed the procedure including the risks, benefits and alternatives for the proposed anesthesia with the patient or authorized representative who has indicated his/her understanding and acceptance.   Dental advisory given  Plan Discussed with: CRNA  Anesthesia Plan Comments:         Anesthesia Quick Evaluation

## 2013-08-21 NOTE — Preoperative (Signed)
Beta Blockers   Reason not to administer Beta Blockers:Not Applicable 

## 2013-08-22 NOTE — Op Note (Signed)
Brenda Schmidt, Brenda Schmidt               ACCOUNT NO.:  [id number]  MEDICAL RECORD NO.:  [id number]  LOCATION:  [facility]                         FACILITY:  [facility]  PHYSICIAN:  Excell Seltzer. Annabell Howells, M.D.    DATE OF BIRTH:  08/07/1929  DATE OF PROCEDURE:  08/21/2013 DATE OF DISCHARGE:  08/21/2013                              OPERATIVE REPORT   PREOPERATIVE DIAGNOSIS:  LEFT PROXIMAL URETERAL STONE.  POSTOPERATIVE DIAGNOSIS:  LEFT PROXIMAL URETERAL STONE.  PROCEDURE:  CYSTOSCOPY, LEFT RETROGRADE PYELOGRAM WITH INTERPRETATION, LEFT URETEROSCOPIC STONE EXTRACTION WITH HOLMIUM LASER TRIPS INSERTION OF LEFT DOUBLE-J STENT.  SURGEON:  Anner Crete, M.D.  ANESTHESIA:  GENERAL.  SPECIMEN:  Stone fragments.  DRAINS:  A 6-French 24 cm double-J stent.  BLOOD LOSS:  Minimal.  COMPLICATIONS:  None.  INDICATIONS:  Ms. Stiverson is an 79 year old white female with a history of stones.  She was seen in [facility] over the weekend with left flank pain, and was found have a 4 x 7 mm obstructing left ureteral stone.  She continued to remain symptomatic today and a KUB revealed no progression of her stone.  She elected to undergo a cystoscopy with attempted ureteroscopy and stent placement.  FINDINGS OF PROCEDURE:  She was given Cipro.  She was taken to the operating room where general anesthetic was induced.  She was placed in lithotomy position and fitted with PAS hose.  Her perineum and genitalia were prepped with Betadine solution.  She was draped in usual sterile fashion.  Cystoscopy was performed using a 22-French scope and 12-degree lens. Examination revealed a normal urethra.  The bladder wall was smooth and pale without tumor, stones, or inflammation.  Ureteral orifices were unremarkable.  Left ureteral orifice was cannulated with 5-French open-end catheter and contrast was instilled.  The retrograde pyelogram revealed a normal ureter.  Initially, there was a filling defect in the proximal  ureter but it flushed back into the renal pelvis.  There was mild dilation of the renal pelvis and calices.  Once the retrograde pyelogram was completed, a guidewire was passed to the kidney, and a 12-French introducer sheath inner core was passed gently to the kidney under fluoroscopic guidance.  There was minimal resistance to passage.  At this point, these sheath was reassembled and a 38 cm digital access sheath was then passed to the kidney.  It was snug but not difficult to pass.  Once the tip reached the kidney, the inner core and wire were removed.  I then advanced a digital flexible ureteroscope into the kidney, where the stone was visualized in upper pole calyx.  The stone was readily accessible and was then engaged with the laser using a 200 micron fiber, set on 5 joules and 10 hertz, the stone fragmented readily into manageable fragments, which were then removed with a Nitinol basket, but the smallest fragments were removed.  Once all stone fragments that could be removed were removed, the scope was removed and a guidewire was then advanced to the kidney through the sheath.  The sheath was removed leaving the guidewire in place.  The cystoscope was reinserted over the wire and a 6-French, 24 cm double-J stent was  inserted to the kidney under fluoroscopic guidance.  The wire was removed leaving good coil in the kidney and a good coil in the bladder.  The bladder was then drained.  The cystoscope was removed. The patient was taken down from lithotomy position.  Her anesthetic was reversed.  She was moved to recovery in stable condition.  There were no complications.     Excell Seltzer. Annabell Howells, M.D.     JJW/MEDQ  D:  08/21/2013  T:  08/22/2013  Job:  [id number]

## 2013-08-23 ENCOUNTER — Encounter (HOSPITAL_COMMUNITY): Payer: Self-pay | Admitting: Urology

## 2013-09-01 ENCOUNTER — Ambulatory Visit (INDEPENDENT_AMBULATORY_CARE_PROVIDER_SITE_OTHER): Payer: Medicare Other | Admitting: Urology

## 2013-09-01 ENCOUNTER — Other Ambulatory Visit: Payer: Self-pay | Admitting: Urology

## 2013-09-01 DIAGNOSIS — N200 Calculus of kidney: Secondary | ICD-10-CM

## 2013-09-01 DIAGNOSIS — N201 Calculus of ureter: Secondary | ICD-10-CM

## 2013-09-06 ENCOUNTER — Ambulatory Visit (HOSPITAL_COMMUNITY)
Admission: RE | Admit: 2013-09-06 | Discharge: 2013-09-06 | Disposition: A | Discharge status: Home / Self Care | Payer: Medicare Other | Source: Ambulatory Visit | Attending: Urology | Admitting: Urology

## 2013-09-06 ENCOUNTER — Other Ambulatory Visit: Payer: Self-pay | Admitting: Urology

## 2013-09-06 DIAGNOSIS — N201 Calculus of ureter: Secondary | ICD-10-CM

## 2013-09-06 DIAGNOSIS — R935 Abnormal findings on diagnostic imaging of other abdominal regions, including retroperitoneum: Secondary | ICD-10-CM | POA: Insufficient documentation

## 2013-09-06 DIAGNOSIS — Z96649 Presence of unspecified artificial hip joint: Secondary | ICD-10-CM | POA: Insufficient documentation

## 2013-09-06 DIAGNOSIS — Z0389 Encounter for observation for other suspected diseases and conditions ruled out: Secondary | ICD-10-CM | POA: Insufficient documentation

## 2013-09-22 ENCOUNTER — Ambulatory Visit (INDEPENDENT_AMBULATORY_CARE_PROVIDER_SITE_OTHER): Payer: Medicare Other | Admitting: Urology

## 2013-09-22 DIAGNOSIS — N201 Calculus of ureter: Secondary | ICD-10-CM

## 2013-09-22 DIAGNOSIS — N200 Calculus of kidney: Secondary | ICD-10-CM

## 2013-10-03 ENCOUNTER — Other Ambulatory Visit: Payer: Self-pay | Admitting: Adult Health

## 2013-11-17 ENCOUNTER — Ambulatory Visit (INDEPENDENT_AMBULATORY_CARE_PROVIDER_SITE_OTHER): Payer: Medicare Other | Admitting: Urology

## 2013-11-17 DIAGNOSIS — N200 Calculus of kidney: Secondary | ICD-10-CM

## 2013-11-17 DIAGNOSIS — R7989 Other specified abnormal findings of blood chemistry: Secondary | ICD-10-CM

## 2013-11-17 DIAGNOSIS — E213 Hyperparathyroidism, unspecified: Secondary | ICD-10-CM

## 2013-11-17 DIAGNOSIS — N393 Stress incontinence (female) (male): Secondary | ICD-10-CM

## 2014-01-08 ENCOUNTER — Other Ambulatory Visit: Payer: Self-pay | Admitting: Adult Health
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
## 2015-01-17 IMAGING — CR DG HIP 1V PORT*R*
1 series · 2 of 2 positions shown · non-contrast
Comparison: None.

CLINICAL DATA: Postop right hip replacement

PORTABLE RIGHT HIP - 1 VIEW

[Series 1: ap (open mouth) · 2 of 2 slices shown]
[im 1/2]
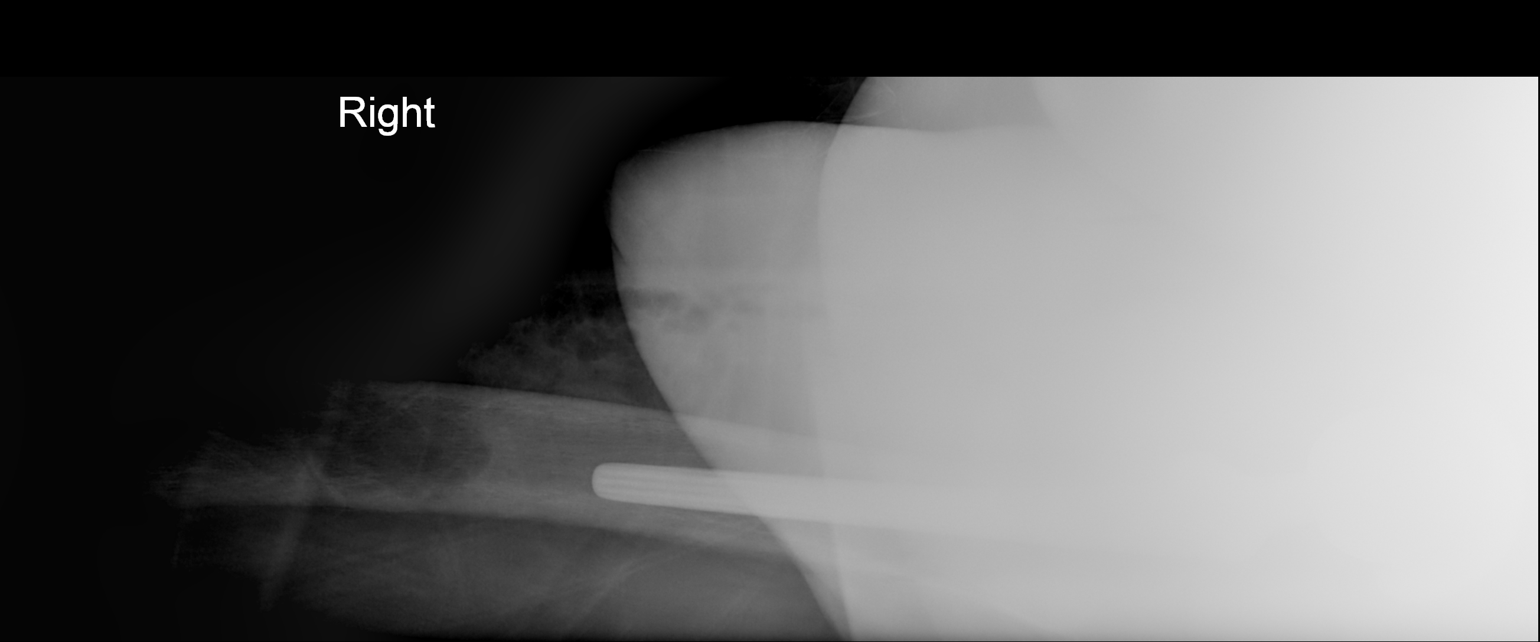
[im 2/2]
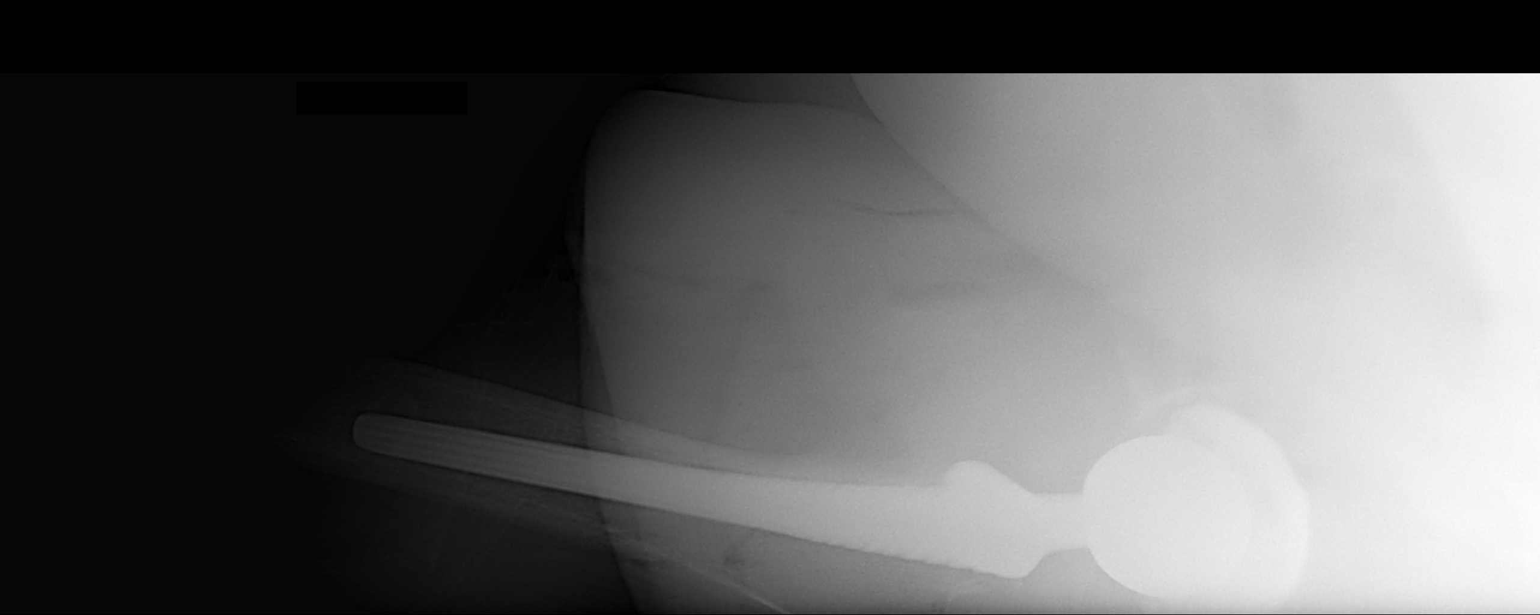

[2 of 2 positions shown; findings below may reference images not displayed]

FINDINGS: Cross-table lateral views of the right femoral component
shows slightly eccentrically positioned within the proximal right
femoral shaft.  No acute fracture is seen.
IMPRESSION: Right femoral prosthesis present with no acute abnormality.

## 2015-06-13 ENCOUNTER — Encounter (INDEPENDENT_AMBULATORY_CARE_PROVIDER_SITE_OTHER): Payer: Self-pay | Admitting: *Deleted

## 2015-07-17 ENCOUNTER — Ambulatory Visit (INDEPENDENT_AMBULATORY_CARE_PROVIDER_SITE_OTHER): Payer: Medicare Other | Admitting: Internal Medicine

## 2016-09-09 ENCOUNTER — Ambulatory Visit (INDEPENDENT_AMBULATORY_CARE_PROVIDER_SITE_OTHER): Payer: Medicare Other | Admitting: Orthopaedic Surgery

## 2016-09-15 ENCOUNTER — Ambulatory Visit (INDEPENDENT_AMBULATORY_CARE_PROVIDER_SITE_OTHER): Payer: Self-pay

## 2016-09-15 ENCOUNTER — Ambulatory Visit (INDEPENDENT_AMBULATORY_CARE_PROVIDER_SITE_OTHER): Payer: Medicare Other | Admitting: Physician Assistant

## 2016-09-15 ENCOUNTER — Encounter (INDEPENDENT_AMBULATORY_CARE_PROVIDER_SITE_OTHER): Payer: Self-pay | Admitting: Orthopaedic Surgery

## 2016-09-15 ENCOUNTER — Ambulatory Visit (INDEPENDENT_AMBULATORY_CARE_PROVIDER_SITE_OTHER): Payer: Medicare Other

## 2016-09-15 DIAGNOSIS — M25561 Pain in right knee: Secondary | ICD-10-CM

## 2016-09-15 DIAGNOSIS — M25562 Pain in left knee: Secondary | ICD-10-CM

## 2016-09-15 MED ORDER — METHYLPREDNISOLONE ACETATE 40 MG/ML IJ SUSP
40.0000 mg | INTRAMUSCULAR | Status: AC | PRN
  Administered 2016-09-15: 40 mg via INTRA_ARTICULAR

## 2016-09-15 MED ORDER — LIDOCAINE HCL 1 % IJ SOLN
5.0000 mL | INTRAMUSCULAR | Status: AC | PRN
  Administered 2016-09-15: 5 mL
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
## 2016-09-30 ENCOUNTER — Ambulatory Visit (INDEPENDENT_AMBULATORY_CARE_PROVIDER_SITE_OTHER): Payer: Medicare Other | Admitting: Physician Assistant

## 2016-09-30 DIAGNOSIS — M1712 Unilateral primary osteoarthritis, left knee: Secondary | ICD-10-CM | POA: Diagnosis not present
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
## 2017-08-31 ENCOUNTER — Ambulatory Visit (INDEPENDENT_AMBULATORY_CARE_PROVIDER_SITE_OTHER): Payer: Medicare Other | Admitting: Orthopaedic Surgery

## 2017-08-31 ENCOUNTER — Encounter (INDEPENDENT_AMBULATORY_CARE_PROVIDER_SITE_OTHER): Payer: Self-pay | Admitting: Orthopaedic Surgery

## 2017-08-31 ENCOUNTER — Ambulatory Visit (INDEPENDENT_AMBULATORY_CARE_PROVIDER_SITE_OTHER): Payer: Medicare Other

## 2017-08-31 DIAGNOSIS — M25511 Pain in right shoulder: Secondary | ICD-10-CM

## 2017-08-31 DIAGNOSIS — G8929 Other chronic pain: Secondary | ICD-10-CM

## 2017-08-31 MED ORDER — METHYLPREDNISOLONE ACETATE 40 MG/ML IJ SUSP
40.0000 mg | INTRAMUSCULAR | Status: AC | PRN
  Administered 2017-08-31: 40 mg via INTRA_ARTICULAR

## 2017-08-31 MED ORDER — LIDOCAINE HCL 1 % IJ SOLN
3.0000 mL | INTRAMUSCULAR | Status: AC | PRN
Start: 2017-08-31 — End: 2017-08-31
  Administered 2017-08-31: 3 mL
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
## 2017-09-14 ENCOUNTER — Ambulatory Visit (INDEPENDENT_AMBULATORY_CARE_PROVIDER_SITE_OTHER): Payer: Medicare Other | Admitting: Orthopaedic Surgery

## 2017-12-13 ENCOUNTER — Ambulatory Visit (INDEPENDENT_AMBULATORY_CARE_PROVIDER_SITE_OTHER): Payer: Medicare Other | Admitting: Orthopaedic Surgery

## 2017-12-13 ENCOUNTER — Encounter (INDEPENDENT_AMBULATORY_CARE_PROVIDER_SITE_OTHER): Payer: Self-pay | Admitting: Orthopaedic Surgery

## 2017-12-13 ENCOUNTER — Ambulatory Visit (INDEPENDENT_AMBULATORY_CARE_PROVIDER_SITE_OTHER): Payer: Medicare Other

## 2017-12-13 ENCOUNTER — Telehealth (INDEPENDENT_AMBULATORY_CARE_PROVIDER_SITE_OTHER): Payer: Self-pay | Admitting: Orthopaedic Surgery

## 2017-12-13 DIAGNOSIS — M25551 Pain in right hip: Secondary | ICD-10-CM

## 2017-12-13 DIAGNOSIS — M5441 Lumbago with sciatica, right side: Secondary | ICD-10-CM | POA: Diagnosis not present

## 2017-12-13 DIAGNOSIS — Z96641 Presence of right artificial hip joint: Secondary | ICD-10-CM | POA: Insufficient documentation

## 2017-12-13 MED ORDER — METHYLPREDNISOLONE 4 MG PO TABS: ORAL_TABLET | ORAL | 0 refills | Status: DC

## 2017-12-13 MED ORDER — GABAPENTIN 100 MG PO CAPS: 100.0000 mg | ORAL_CAPSULE | Freq: Every day | ORAL | 0 refills | Status: DC
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
## 2017-12-13 NOTE — Telephone Encounter (Signed)
[facility] [address] [place] [phone]

## 2017-12-13 NOTE — Addendum Note (Signed)
Addended by: Rip HarbourKING, Jaiven Graveline M on: 12/13/2017 04:50 PM   Modules accepted: Orders

## 2017-12-14 NOTE — Telephone Encounter (Signed)
Noted in referral notes 

## 2017-12-27 ENCOUNTER — Other Ambulatory Visit (INDEPENDENT_AMBULATORY_CARE_PROVIDER_SITE_OTHER): Payer: Self-pay

## 2017-12-27 ENCOUNTER — Encounter (INDEPENDENT_AMBULATORY_CARE_PROVIDER_SITE_OTHER): Payer: Self-pay | Admitting: Orthopaedic Surgery

## 2017-12-27 ENCOUNTER — Ambulatory Visit (INDEPENDENT_AMBULATORY_CARE_PROVIDER_SITE_OTHER): Payer: Medicare Other | Admitting: Orthopaedic Surgery

## 2017-12-27 DIAGNOSIS — G8929 Other chronic pain: Secondary | ICD-10-CM

## 2017-12-27 DIAGNOSIS — M5441 Lumbago with sciatica, right side: Secondary | ICD-10-CM | POA: Diagnosis not present

## 2017-12-27 DIAGNOSIS — M5442 Lumbago with sciatica, left side: Principal | ICD-10-CM

## 2017-12-27 MED ORDER — HYDROCODONE-ACETAMINOPHEN 5-325 MG PO TABS: 1.0000 | ORAL_TABLET | Freq: Four times a day (QID) | ORAL | 0 refills | Status: DC | PRN

## 2017-12-27 NOTE — Progress Notes (Signed)
Patient is coming in today to go over the MRI of her lumbar spine.  She still having severe symptoms of sciatica down her right side.  She has had a right hip replacement and that is done well.  She is able with a cane and says she is hurting bad.  She does sleep comfortably at night though.  She points her issue and sciatic is a source of her pain.  He goes down her right leg to her knee and into her foot.  She is not a diabetic.  On exam she does not seem to have much of a positive straight leg raise.  A lot of her findings are subjective on exam but she is very uncomfortable.  I do not have the MRI disc but there is an MRI report and it shows severe stenosis that is multifactorial with a lot of this being on the right side at L4-L5 and L5-S1 with mainly being at L5-S1.  This is due to a disc osteophyte complex combined with facet hypertrophy and a lateral disc.  Based on her symptoms I do feel the most appropriate level likely to have her injected would be an ESI at L5-S1 to the right side.  We will try to use a short course of hydrocodone.  We have already had a steroid taper for as well.  We will work on getting that injection set up to Dr. Alvester Morin.

## 2018-01-06 ENCOUNTER — Telehealth (INDEPENDENT_AMBULATORY_CARE_PROVIDER_SITE_OTHER): Payer: Self-pay

## 2018-01-06 ENCOUNTER — Telehealth (INDEPENDENT_AMBULATORY_CARE_PROVIDER_SITE_OTHER): Payer: Self-pay | Admitting: Orthopaedic Surgery

## 2018-01-06 MED ORDER — HYDROCODONE-ACETAMINOPHEN 5-325 MG PO TABS: 1.0000 | ORAL_TABLET | Freq: Four times a day (QID) | ORAL | 0 refills | Status: DC | PRN

## 2018-01-06 NOTE — Telephone Encounter (Signed)
States there is no way she can drive an hour here for that Rx, can we call her in Tramadol or something?

## 2018-01-06 NOTE — Telephone Encounter (Signed)
Patient states she is in sooo much pain and asking to be seen asap

## 2018-01-06 NOTE — Telephone Encounter (Signed)
Called Rx into pharmacy.  

## 2018-01-06 NOTE — Telephone Encounter (Signed)
Will have to pick up a script

## 2018-01-06 NOTE — Telephone Encounter (Signed)
Please advise 

## 2018-01-06 NOTE — Telephone Encounter (Signed)
Patient called stating has not heard from [facility] office. Advised her per notes they were waiting to hear from [place] office in [place].   Patient stated in so pain she needs refill on Hydrocodone.  Please call patient to advise

## 2018-01-06 NOTE — Telephone Encounter (Signed)
Ok to try to call in tramadol or tylenol #3; either would be 1-2 every 8 hours as needed for pain; #60; no refills

## 2018-01-10 NOTE — Telephone Encounter (Signed)
We are awaiting ok to bc anticoagulation. Form refaxed to prescriber's office.

## 2018-01-13 NOTE — Telephone Encounter (Signed)
Called pt and left vm #1 to schedule appt. Received anticoagulation form from Dr. Oscar La pt is on Xarelto and needs to be off for 2 prior to appt.

## 2018-01-19 ENCOUNTER — Encounter (INDEPENDENT_AMBULATORY_CARE_PROVIDER_SITE_OTHER): Payer: Self-pay | Admitting: Physical Medicine and Rehabilitation

## 2018-01-19 ENCOUNTER — Ambulatory Visit (INDEPENDENT_AMBULATORY_CARE_PROVIDER_SITE_OTHER): Payer: Medicare Other | Admitting: Physical Medicine and Rehabilitation

## 2018-01-19 ENCOUNTER — Ambulatory Visit (INDEPENDENT_AMBULATORY_CARE_PROVIDER_SITE_OTHER): Payer: Medicare Other

## 2018-01-19 VITALS — BP 155/75

## 2018-01-19 DIAGNOSIS — M48062 Spinal stenosis, lumbar region with neurogenic claudication: Secondary | ICD-10-CM | POA: Diagnosis not present

## 2018-01-19 DIAGNOSIS — M5416 Radiculopathy, lumbar region: Secondary | ICD-10-CM | POA: Diagnosis not present

## 2018-01-19 MED ORDER — METHYLPREDNISOLONE ACETATE 80 MG/ML IJ SUSP
80.0000 mg | Freq: Once | INTRAMUSCULAR | Status: AC
  Administered 2018-01-19: 80 mg

## 2018-01-19 NOTE — Progress Notes (Signed)
.  Numeric Pain Rating Scale and Functional Assessment Average Pain 10   In the last MONTH (on 0-10 scale) has pain interfered with the following?  1. General activity like being  able to carry out your everyday physical activities such as walking, climbing stairs, carrying groceries, or moving a chair?  Rating(6)   +Driver, -BT(stopped Xarelto 01/16/18) , -Dye Allergies.

## 2018-01-19 NOTE — Patient Instructions (Signed)
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
## 2018-01-24 NOTE — Procedures (Signed)
Lumbar Epidural Steroid Injection - Interlaminar Approach with Fluoroscopic Guidance  Patient: Brenda Schmidt      Date of Birth: 09/14/1928 MRN: [id number] PCP: Romeo Rabonaplan, Michael, MD      Visit Date: 01/19/2018   Universal Protocol:     Consent Given By: the patient  Position: PRONE  Additional Comments: Vital signs were monitored before and after the procedure. Patient was prepped and draped in the usual sterile fashion. The correct patient, procedure, and site was verified.   Injection Procedure Details:  Procedure Site One Meds Administered:  Meds ordered this encounter  Medications  . methylPREDNISolone acetate (DEPO-MEDROL) injection 80 mg     Laterality: Right  Location/Site:  L5-S1  Needle size: 20 G  Needle type: Tuohy  Needle Placement: Paramedian epidural  Findings:   -Comments: Excellent flow of contrast into the epidural space.  Procedure Details: Using a paramedian approach from the side mentioned above, the region overlying the inferior lamina was localized under fluoroscopic visualization and the soft tissues overlying this structure were infiltrated with 4 ml. of 1% Lidocaine without Epinephrine. The Tuohy needle was inserted into the epidural space using a paramedian approach.   The epidural space was localized using loss of resistance along with lateral and bi-planar fluoroscopic views.  After negative aspirate for air, blood, and CSF, a 2 ml. volume of Isovue-250 was injected into the epidural space and the flow of contrast was observed. Radiographs were obtained for documentation purposes.    The injectate was administered into the level noted above.   Additional Comments:  The patient tolerated the procedure well Dressing: Band-Aid    Post-procedure details: Patient was observed during the procedure. Post-procedure instructions were reviewed.  Patient left the clinic in stable condition.

## 2018-01-26 ENCOUNTER — Ambulatory Visit (INDEPENDENT_AMBULATORY_CARE_PROVIDER_SITE_OTHER): Payer: Medicare Other | Admitting: Orthopaedic Surgery

## 2018-01-26 ENCOUNTER — Encounter (INDEPENDENT_AMBULATORY_CARE_PROVIDER_SITE_OTHER): Payer: Self-pay | Admitting: Orthopaedic Surgery

## 2018-01-26 DIAGNOSIS — M5441 Lumbago with sciatica, right side: Secondary | ICD-10-CM

## 2018-01-26 MED ORDER — TRAMADOL HCL 50 MG PO TABS: 50.0000 mg | ORAL_TABLET | Freq: Four times a day (QID) | ORAL | 0 refills | Status: DC | PRN

## 2018-01-26 NOTE — Progress Notes (Signed)
The patient is following up after having a right-sided L5-S1 injection by Dr. Alvester MorinNewton.  This was last week.  She does report some relief from that injection.  She is 84 years old and has known significant stenosis.  She ambulates with a cane.  She is requesting today is a refill on her tramadol.  She is from [place].  She does state that if she would like to be seen again in any point could she see 1 of our partners in the evening office which is closer where she lives.  On exam she is much more comfortable in terms of examination her lower extremities with a negative straight leg raise today.  She has no significant pain issues today either.  I did refill her tramadol.  Follow-up will be as needed.  If she has any issues at all she will let us know.  All questions concerns were answered and addressed.

## 2018-04-27 ENCOUNTER — Encounter (INDEPENDENT_AMBULATORY_CARE_PROVIDER_SITE_OTHER): Payer: Self-pay | Admitting: Physician Assistant

## 2018-04-27 ENCOUNTER — Ambulatory Visit (INDEPENDENT_AMBULATORY_CARE_PROVIDER_SITE_OTHER): Payer: Medicare Other | Admitting: Physician Assistant

## 2018-04-27 DIAGNOSIS — M48062 Spinal stenosis, lumbar region with neurogenic claudication: Secondary | ICD-10-CM | POA: Diagnosis not present

## 2018-04-27 MED ORDER — HYDROCODONE-ACETAMINOPHEN 5-325 MG PO TABS: 1.0000 | ORAL_TABLET | Freq: Four times a day (QID) | ORAL | 0 refills | Status: DC | PRN

## 2018-04-27 MED ORDER — METHYLPREDNISOLONE 4 MG PO TABS: ORAL_TABLET | ORAL | 0 refills | Status: DC

## 2018-04-27 NOTE — Addendum Note (Signed)
Addended by: Richardean Canal on: 04/27/2018 06:11 PM   Modules accepted: Level of Service
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
## 2018-04-29 ENCOUNTER — Telehealth (INDEPENDENT_AMBULATORY_CARE_PROVIDER_SITE_OTHER): Payer: Self-pay | Admitting: Radiology

## 2018-04-29 ENCOUNTER — Other Ambulatory Visit (INDEPENDENT_AMBULATORY_CARE_PROVIDER_SITE_OTHER): Payer: Self-pay

## 2018-04-29 ENCOUNTER — Other Ambulatory Visit (INDEPENDENT_AMBULATORY_CARE_PROVIDER_SITE_OTHER): Payer: Self-pay | Admitting: *Deleted

## 2018-04-29 DIAGNOSIS — M545 Low back pain: Secondary | ICD-10-CM

## 2018-04-29 NOTE — Telephone Encounter (Signed)
Patient returning call. Please call back [phone]

## 2018-04-29 NOTE — Telephone Encounter (Signed)
Called pt to inform pt that I sent a fax to Dr. Oscar La to hold BT.

## 2018-05-12 NOTE — Telephone Encounter (Signed)
Resubmitted fax to Dr. Oscar Laaplan office to hold BT. Called pt to inform.

## 2018-05-19 NOTE — Telephone Encounter (Signed)
Received fax from pt new Dr to hold Xarelto 2 days prior to injection. Called pt and left vm #1 to get pt scheduled.

## 2018-05-23 ENCOUNTER — Encounter (INDEPENDENT_AMBULATORY_CARE_PROVIDER_SITE_OTHER): Payer: Self-pay | Admitting: Physical Medicine and Rehabilitation

## 2018-05-23 ENCOUNTER — Ambulatory Visit (INDEPENDENT_AMBULATORY_CARE_PROVIDER_SITE_OTHER): Payer: Self-pay

## 2018-05-23 ENCOUNTER — Ambulatory Visit (INDEPENDENT_AMBULATORY_CARE_PROVIDER_SITE_OTHER): Payer: Medicare Other | Admitting: Physical Medicine and Rehabilitation

## 2018-05-23 VITALS — BP 158/69 | HR 65 | Temp 98.4°F

## 2018-05-23 DIAGNOSIS — M5416 Radiculopathy, lumbar region: Secondary | ICD-10-CM

## 2018-05-23 MED ORDER — METHYLPREDNISOLONE ACETATE 80 MG/ML IJ SUSP
80.0000 mg | Freq: Once | INTRAMUSCULAR | Status: AC
  Administered 2018-05-23: 80 mg

## 2018-05-23 NOTE — Progress Notes (Signed)
 .  Numeric Pain Rating Scale and Functional Assessment Average Pain 10   In the last MONTH (on 0-10 scale) has pain interfered with the following?  1. General activity like being  able to carry out your everyday physical activities such as walking, climbing stairs, carrying groceries, or moving a chair?  Rating(9)   +Driver, -BT(stopped xarelto 05/20/18), -Dye Allergies.

## 2018-05-23 NOTE — Patient Instructions (Signed)

## 2018-05-24 NOTE — Procedures (Signed)
Lumbar Epidural Steroid Injection - Interlaminar Approach with Fluoroscopic Guidance  Patient: Brenda [place]      Date of Birth: [birth year] MRN: [id number] PCP: Romeo Rabon, MD      Visit Date: 05/23/2018   Universal Protocol:     Consent Given By: the patient  Position: PRONE  Additional Comments: Vital signs were monitored before and after the procedure. Patient was prepped and draped in the usual sterile fashion. The correct patient, procedure, and site was verified.   Injection Procedure Details:  Procedure Site One Meds Administered:  Meds ordered this encounter  Medications  . methylPREDNISolone acetate (DEPO-MEDROL) injection 80 mg     Laterality: Right  Location/Site:  L4-L5  Needle size: 20 G  Needle type: Tuohy  Needle Placement: Paramedian epidural  Findings:   -Comments: Excellent flow of contrast into the epidural space.  Procedure Details: Using a paramedian approach from the side mentioned above, the region overlying the inferior lamina was localized under fluoroscopic visualization and the soft tissues overlying this structure were infiltrated with 4 ml. of 1% Lidocaine without Epinephrine. The Tuohy needle was inserted into the epidural space using a paramedian approach.   The epidural space was localized using loss of resistance along with lateral and bi-planar fluoroscopic views.  After negative aspirate for air, blood, and CSF, a 2 ml. volume of Isovue-250 was injected into the epidural space and the flow of contrast was observed. Radiographs were obtained for documentation purposes.    The injectate was administered into the level noted above.   Additional Comments:  The patient tolerated the procedure well Dressing: Band-Aid    Post-procedure details: Patient was observed during the procedure. Post-procedure instructions were reviewed.  Patient left the clinic in stable condition.

## 2018-05-24 NOTE — Progress Notes (Signed)
Brenda Schmidt - 84 y.o. female MRN [id number]  Date of birth: 01-31-1929  Office Visit Note: Visit Date: 05/23/2018 PCP: Romeo Rabon, MD Referred by: Romeo Rabon, MD  Subjective: Chief Complaint  Patient presents with  . Lower Back - Pain  . Right Lower Leg - Pain   HPI: Brenda Schmidt is an 84 year old female with chronic worsening low back and right hip and leg pain consistent with pain from stenosis and neurogenic claudication.  She has moderate to severe stenosis at L4-5.  Prior L5-S1 injection gave her quite a bit of relief but it was very short-term.  She is on anticoagulation we had difficulty getting permission to take her off of her anticoagulation because she needed to change doctors that were managing this condition.  She has been off of her medication now appropriately.  We are going to complete an L4-5 interlaminar injection.  Depending on relief would look at transforaminal approach.   ROS Otherwise per HPI.  Assessment & Plan: Visit Diagnoses:  1. Lumbar radiculopathy     Plan: No additional findings.   Meds & Orders:  Meds ordered this encounter  Medications  . methylPREDNISolone acetate (DEPO-MEDROL) injection 80 mg    Orders Placed This Encounter  Procedures  . XR C-ARM NO REPORT  . Epidural Steroid injection    Follow-up: No follow-ups on file.   Procedures: No procedures performed  Lumbar Epidural Steroid Injection - Interlaminar Approach with Fluoroscopic Guidance  Patient: Brenda Schmidt      Date of Birth: 10/30/1928 MRN: [id number] PCP: Romeo Rabon, MD      Visit Date: 05/23/2018   Universal Protocol:     Consent Given By: the patient  Position: PRONE  Additional Comments: Vital signs were monitored before and after the procedure. Patient was prepped and draped in the usual sterile fashion. The correct patient, procedure, and site was verified.   Injection Procedure Details:  Procedure Site One Meds Administered:  Meds ordered  this encounter  Medications  . methylPREDNISolone acetate (DEPO-MEDROL) injection 80 mg     Laterality: Right  Location/Site:  L4-L5  Needle size: 20 G  Needle type: Tuohy  Needle Placement: Paramedian epidural  Findings:   -Comments: Excellent flow of contrast into the epidural space.  Procedure Details: Using a paramedian approach from the side mentioned above, the region overlying the inferior lamina was localized under fluoroscopic visualization and the soft tissues overlying this structure were infiltrated with 4 ml. of 1% Lidocaine without Epinephrine. The Tuohy needle was inserted into the epidural space using a paramedian approach.   The epidural space was localized using loss of resistance along with lateral and bi-planar fluoroscopic views.  After negative aspirate for air, blood, and CSF, a 2 ml. volume of Isovue-250 was injected into the epidural space and the flow of contrast was observed. Radiographs were obtained for documentation purposes.    The injectate was administered into the level noted above.   Additional Comments:  The patient tolerated the procedure well Dressing: Band-Aid    Post-procedure details: Patient was observed during the procedure. Post-procedure instructions were reviewed.  Patient left the clinic in stable condition.   Clinical History: Exam Date: 12/20/2017 Procedure: MRI LUMBAR SPINE W/O CONTRAST page 1 of 4 [id number] MRI LUMBAR SPINE W/O CO LBP 72148 Reason for study: LBP Comparison: CT abdomen pelvis 11/03/09 Technique: Multiplanar, multisequence MR images of the lumbar spine extending from T12 to the sacrum was obtained. No contrast was administered. Findings: The visualized kidneys, liver  and left adrenal gland are unremarkable. A right adrenal 11 mm nodule is stable. No evidence for acute compression fracture. There is fatty replacement and osteopenia of the bony structures. L1-L2: Severe disc desiccation. Minimal grade 1  retrolisthesis. Moderate facet arthropathy and ligament flavum thickening. Mild broad-based disc bulge with lateral disc protrusion. L2-L3: Severe disc desiccation. Moderate facet arthropathy and ligament flavum thickening. Moderate bilateral neuroforamina narrowing without spinal stenosis. Minimal grade 1 retrolisthesis. L3-L4: Minimal grade 1 retrolisthesis with severe disc desiccation. Moderate facet arthropathy and ligament flavum thickening. Mild broad-based disc bulge with lateral disc protrusion. There is moderate right with severe left neuroforamina narrowing as well as moderate spinal stenosis. L4-L5: Severe facet arthropathy and ligament flavum thickening. Mild broad-based disc bulge with lateral disc protrusion. There is grade 1 anterolisthesis. There is moderate bilateral neuroforamina narrowing with moderate-severe spinal stenosis. L5-S1: Mild broad-based disc bulge with lateral disc protrusion. Moderate left and severe right facet arthropathy with lateral disc osteophyte complex. There is severe right with mild left neuroforaminal narrowing. IMPRESSION: Multilevel degenerative changes with most significant findings, severe left L3-L4 and right L5-S1 neuroforamina narrowing with moderate to severe spinal stenosis at L4-L5. Additional findings as above.     Objective:  VS:  HT:    WT:   BMI:     BP:(!) 158/69  HR:65bpm  TEMP:98.4 F (36.9 C)(Oral)  RESP:  Physical Exam  Ortho Exam Imaging: Xr C-arm No Report  Result Date: 05/23/2018 Please see Notes tab for imaging impression.

## 2018-05-26 ENCOUNTER — Ambulatory Visit (INDEPENDENT_AMBULATORY_CARE_PROVIDER_SITE_OTHER): Payer: Medicare Other | Admitting: Physician Assistant

## 2018-06-06 ENCOUNTER — Telehealth (INDEPENDENT_AMBULATORY_CARE_PROVIDER_SITE_OTHER): Payer: Self-pay | Admitting: *Deleted

## 2018-06-20 ENCOUNTER — Encounter (INDEPENDENT_AMBULATORY_CARE_PROVIDER_SITE_OTHER): Payer: Self-pay | Admitting: Physician Assistant

## 2018-06-20 ENCOUNTER — Ambulatory Visit (INDEPENDENT_AMBULATORY_CARE_PROVIDER_SITE_OTHER): Payer: Medicare Other | Admitting: Physician Assistant

## 2018-06-20 VITALS — Ht 62.0 in | Wt 189.0 lb

## 2018-06-20 DIAGNOSIS — M170 Bilateral primary osteoarthritis of knee: Secondary | ICD-10-CM

## 2018-06-20 MED ORDER — LIDOCAINE HCL 1 % IJ SOLN
3.0000 mL | INTRAMUSCULAR | Status: AC | PRN
  Administered 2018-06-20: 3 mL

## 2018-06-20 MED ORDER — METHYLPREDNISOLONE ACETATE 40 MG/ML IJ SUSP
40.0000 mg | INTRAMUSCULAR | Status: AC | PRN
  Administered 2018-06-20: 40 mg via INTRA_ARTICULAR

## 2018-06-20 NOTE — Progress Notes (Signed)
   Procedure Note  Patient: Brenda Schmidt             Date of Birth: [birth year]           MRN: [id number]             Visit Date: 06/20/2018  HPI: Brenda Schmidt returns today status post epidural steroid injections.  She had epidural steroid injections by Dr. Alvester Morin on 05/23/2018 states that this is helped with her calf pain on the right and her back pain is gone.  She is now complaining of bilateral knee pain.  She has known osteoarthritis of both knees.  She has had no new injury to either knee.  She is been taking Tylenol for the knee pain.  Physical exam General well-developed well-nourished female no acute distress made affect appropriate Bilateral knees good range of motion no effusion abnormal warmth slight edema of the left knee.  No instability valgus varus stressing of either knee.  Procedures: Visit Diagnoses: Bilateral primary osteoarthritis of knee  Large Joint Inj: bilateral knee on 06/20/2018 6:16 PM Indications: pain Details: 22 G 1.5 in needle, anterolateral approach  Arthrogram: No  Medications (Right): 3 mL lidocaine 1 %; 40 mg methylPREDNISolone acetate 40 MG/ML Medications (Left): 3 mL lidocaine 1 %; 40 mg methylPREDNISolone acetate 40 MG/ML Outcome: tolerated well, no immediate complications Procedure, treatment alternatives, risks and benefits explained, specific risks discussed. Consent was given by the patient. Immediately prior to procedure a time out was called to verify the correct patient, procedure, equipment, support staff and site/side marked as required. Patient was prepped and draped in the usual sterile fashion.    Plan: She will follow-up on as-needed basis.  She understands that she can have cortisone injection both knees no more often than every 3 months.  Questions encouraged and answered

## 2018-08-29 ENCOUNTER — Telehealth (INDEPENDENT_AMBULATORY_CARE_PROVIDER_SITE_OTHER): Payer: Self-pay | Admitting: Orthopaedic Surgery

## 2018-08-29 DIAGNOSIS — M48062 Spinal stenosis, lumbar region with neurogenic claudication: Secondary | ICD-10-CM

## 2018-08-29 NOTE — Telephone Encounter (Signed)
Patient left message requesting a referral to see Dr Alvester Morin for her sciatica nerve pain.

## 2018-08-29 NOTE — Telephone Encounter (Signed)
That [facility] be fine [facility]

## 2018-08-29 NOTE — Telephone Encounter (Signed)
Ok for referral?

## 2018-08-29 NOTE — Telephone Encounter (Signed)
I called and spoke with patient. She states that she is having so much pain that she can hardly sit down. I asked if she wanted me to send a message back to Dr. Magnus IvanBlackman to see if there is anything that she can do until her appointment with [facility]. [facility] and she states that she is taking Tylenol and trying to sit still. She would just like to have the injection scheduled as soon as possible.  Referral entered.

## 2018-09-04 ENCOUNTER — Inpatient Hospital Stay (HOSPITAL_COMMUNITY): Payer: Medicare Other

## 2018-09-04 ENCOUNTER — Inpatient Hospital Stay (HOSPITAL_COMMUNITY)
Admission: AD | Admit: 2018-09-04 | Discharge: 2018-09-07 | DRG: 683 | Disposition: A | Discharge status: Skilled Nursing Facility | Payer: Medicare Other | Source: Other Acute Inpatient Hospital | Attending: Internal Medicine | Admitting: Internal Medicine

## 2018-09-04 ENCOUNTER — Encounter (HOSPITAL_COMMUNITY): Payer: Self-pay | Admitting: Internal Medicine

## 2018-09-04 DIAGNOSIS — N390 Urinary tract infection, site not specified: Secondary | ICD-10-CM | POA: Diagnosis present

## 2018-09-04 DIAGNOSIS — Z87442 Personal history of urinary calculi: Secondary | ICD-10-CM | POA: Diagnosis not present

## 2018-09-04 DIAGNOSIS — N3000 Acute cystitis without hematuria: Secondary | ICD-10-CM

## 2018-09-04 DIAGNOSIS — T796XXA Traumatic ischemia of muscle, initial encounter: Secondary | ICD-10-CM | POA: Diagnosis not present

## 2018-09-04 DIAGNOSIS — Z96641 Presence of right artificial hip joint: Secondary | ICD-10-CM | POA: Diagnosis present

## 2018-09-04 DIAGNOSIS — M79601 Pain in right arm: Secondary | ICD-10-CM | POA: Diagnosis not present

## 2018-09-04 DIAGNOSIS — S7002XA Contusion of left hip, initial encounter: Secondary | ICD-10-CM

## 2018-09-04 DIAGNOSIS — S72002A Fracture of unspecified part of neck of left femur, initial encounter for closed fracture: Secondary | ICD-10-CM | POA: Diagnosis not present

## 2018-09-04 DIAGNOSIS — Z7901 Long term (current) use of anticoagulants: Secondary | ICD-10-CM

## 2018-09-04 DIAGNOSIS — N179 Acute kidney failure, unspecified: Secondary | ICD-10-CM | POA: Diagnosis present

## 2018-09-04 DIAGNOSIS — G8929 Other chronic pain: Secondary | ICD-10-CM | POA: Diagnosis present

## 2018-09-04 DIAGNOSIS — M25519 Pain in unspecified shoulder: Secondary | ICD-10-CM | POA: Diagnosis present

## 2018-09-04 DIAGNOSIS — D509 Iron deficiency anemia, unspecified: Secondary | ICD-10-CM | POA: Diagnosis present

## 2018-09-04 DIAGNOSIS — K2270 Barrett's esophagus without dysplasia: Secondary | ICD-10-CM | POA: Diagnosis present

## 2018-09-04 DIAGNOSIS — M6282 Rhabdomyolysis: Secondary | ICD-10-CM | POA: Diagnosis present

## 2018-09-04 DIAGNOSIS — Z86711 Personal history of pulmonary embolism: Secondary | ICD-10-CM | POA: Diagnosis not present

## 2018-09-04 DIAGNOSIS — Z79891 Long term (current) use of opiate analgesic: Secondary | ICD-10-CM

## 2018-09-04 DIAGNOSIS — K219 Gastro-esophageal reflux disease without esophagitis: Secondary | ICD-10-CM | POA: Diagnosis present

## 2018-09-04 DIAGNOSIS — T148XXA Other injury of unspecified body region, initial encounter: Secondary | ICD-10-CM

## 2018-09-04 DIAGNOSIS — Z86718 Personal history of other venous thrombosis and embolism: Secondary | ICD-10-CM | POA: Diagnosis not present

## 2018-09-04 DIAGNOSIS — D638 Anemia in other chronic diseases classified elsewhere: Secondary | ICD-10-CM | POA: Diagnosis present

## 2018-09-04 DIAGNOSIS — I10 Essential (primary) hypertension: Secondary | ICD-10-CM | POA: Diagnosis present

## 2018-09-04 DIAGNOSIS — S72009A Fracture of unspecified part of neck of unspecified femur, initial encounter for closed fracture: Secondary | ICD-10-CM | POA: Diagnosis present

## 2018-09-04 DIAGNOSIS — Z09 Encounter for follow-up examination after completed treatment for conditions other than malignant neoplasm: Secondary | ICD-10-CM

## 2018-09-04 LAB — SURGICAL PCR SCREEN
MRSA, PCR: NEGATIVE
Staphylococcus aureus: NEGATIVE

## 2018-09-04 LAB — COMPREHENSIVE METABOLIC PANEL
ALBUMIN: 2.8 g/dL — AB (ref 3.5–5.0)
ALT: 32 U/L (ref 0–44)
AST: 64 U/L — ABNORMAL HIGH (ref 15–41)
Alkaline Phosphatase: 65 U/L (ref 38–126)
Anion gap: 11 (ref 5–15)
BUN: 54 mg/dL — ABNORMAL HIGH (ref 8–23)
CHLORIDE: 108 mmol/L (ref 98–111)
CO2: 20 mmol/L — AB (ref 22–32)
Calcium: 9.9 mg/dL (ref 8.9–10.3)
Creatinine, Ser: 1.92 mg/dL — ABNORMAL HIGH (ref 0.44–1.00)
GFR calc Af Amer: 27 mL/min — ABNORMAL LOW (ref >60)
GFR calc non Af Amer: 23 mL/min — ABNORMAL LOW (ref >60)
Glucose, Bld: 129 mg/dL — ABNORMAL HIGH (ref 70–99)
Potassium: 4 mmol/L (ref 3.5–5.1)
SODIUM: 139 mmol/L (ref 135–145)
Total Bilirubin: 0.7 mg/dL (ref 0.3–1.2)
Total Protein: 5.6 g/dL — ABNORMAL LOW (ref 6.5–8.1)

## 2018-09-04 LAB — PROTIME-INR
INR: 1.22
Prothrombin Time: 15.3 seconds — ABNORMAL HIGH (ref 11.4–15.2)

## 2018-09-04 LAB — CBC
HCT: 40.3 % (ref 36.0–46.0)
Hemoglobin: 11.7 g/dL — ABNORMAL LOW (ref 12.0–15.0)
MCH: 24.2 pg — ABNORMAL LOW (ref 26.0–34.0)
MCHC: 29 g/dL — ABNORMAL LOW (ref 30.0–36.0)
MCV: 83.4 fL (ref 80.0–100.0)
Platelets: 351 10*3/uL (ref 150–400)
RBC: 4.83 MIL/uL (ref 3.87–5.11)
RDW: 17.1 % — ABNORMAL HIGH (ref 11.5–15.5)
WBC: 14.1 10*3/uL — ABNORMAL HIGH (ref 4.0–10.5)
nRBC: 0 % (ref 0.0–0.2)

## 2018-09-04 LAB — CK: Total CK: 1760 U/L — ABNORMAL HIGH (ref 38–234)

## 2018-09-04 MED ORDER — ACETAMINOPHEN 650 MG RE SUPP: 650.0000 mg | Freq: Four times a day (QID) | RECTAL | Status: DC | PRN

## 2018-09-04 MED ORDER — OMEPRAZOLE MAGNESIUM 20 MG PO TBEC: 20.0000 mg | DELAYED_RELEASE_TABLET | ORAL | Status: DC | PRN

## 2018-09-04 MED ORDER — MORPHINE SULFATE (PF) 2 MG/ML IV SOLN: 2.0000 mg | INTRAVENOUS | Status: DC | PRN

## 2018-09-04 MED ORDER — PANTOPRAZOLE SODIUM 40 MG PO TBEC
40.0000 mg | DELAYED_RELEASE_TABLET | Freq: Every day | ORAL | Status: DC | PRN
  Filled 2018-09-04: qty 1

## 2018-09-04 MED ORDER — POLYVINYL ALCOHOL 1.4 % OP SOLN
1.0000 [drp] | Freq: Three times a day (TID) | OPHTHALMIC | Status: DC | PRN
  Filled 2018-09-04: qty 15

## 2018-09-04 MED ORDER — AMLODIPINE BESYLATE 10 MG PO TABS
10.0000 mg | ORAL_TABLET | Freq: Every day | ORAL | Status: DC
  Administered 2018-09-04 – 2018-09-07 (×4): 10 mg via ORAL
  Filled 2018-09-04 (×4): qty 1

## 2018-09-04 MED ORDER — SODIUM CHLORIDE 0.9 % IV SOLN
1.0000 g | INTRAVENOUS | Status: DC
  Administered 2018-09-04 – 2018-09-05 (×2): 1 g via INTRAVENOUS
  Filled 2018-09-04 (×2): qty 10

## 2018-09-04 MED ORDER — ALBUTEROL SULFATE (2.5 MG/3ML) 0.083% IN NEBU
2.5000 mg | INHALATION_SOLUTION | RESPIRATORY_TRACT | Status: DC | PRN
  Administered 2018-09-04: 2.5 mg via RESPIRATORY_TRACT
  Filled 2018-09-04: qty 3

## 2018-09-04 MED ORDER — OXYCODONE HCL 5 MG PO TABS
5.0000 mg | ORAL_TABLET | Freq: Four times a day (QID) | ORAL | Status: DC | PRN
  Filled 2018-09-04: qty 1

## 2018-09-04 MED ORDER — ACETAMINOPHEN 325 MG PO TABS
650.0000 mg | ORAL_TABLET | Freq: Four times a day (QID) | ORAL | Status: DC | PRN
  Administered 2018-09-06 – 2018-09-07 (×4): 650 mg via ORAL
  Filled 2018-09-04 (×4): qty 2

## 2018-09-04 MED ORDER — PROMETHAZINE HCL 25 MG PO TABS: 12.5000 mg | ORAL_TABLET | Freq: Four times a day (QID) | ORAL | Status: DC | PRN

## 2018-09-04 MED ORDER — SODIUM CHLORIDE 0.9 % IV BOLUS
1000.0000 mL | Freq: Once | INTRAVENOUS | Status: AC
  Administered 2018-09-04: 1000 mL via INTRAVENOUS

## 2018-09-04 MED ORDER — SODIUM CHLORIDE 0.9 % IV SOLN
INTRAVENOUS | Status: DC
  Administered 2018-09-04: 10:00:00 via INTRAVENOUS

## 2018-09-04 MED ORDER — DOCUSATE SODIUM 100 MG PO CAPS
100.0000 mg | ORAL_CAPSULE | Freq: Two times a day (BID) | ORAL | Status: DC
  Administered 2018-09-04 – 2018-09-07 (×5): 100 mg via ORAL
  Filled 2018-09-04 (×6): qty 1
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
## 2018-09-04 NOTE — Plan of Care (Signed)
  Problem: Clinical Measurements: Goal: Ability to maintain clinical measurements within normal limits will improve Outcome: Progressing   Problem: Activity: Goal: Risk for activity intolerance will decrease Outcome: Progressing   Problem: Nutrition: Goal: Adequate nutrition will be maintained Outcome: Progressing   Problem: Elimination: Goal: Will not experience complications related to bowel motility Outcome: Progressing   Problem: Safety: Goal: Ability to remain free from injury will improve Outcome: Progressing   Problem: Skin Integrity: Goal: Risk for impaired skin integrity will decrease Outcome: Progressing   

## 2018-09-04 NOTE — Progress Notes (Signed)
Patient arrived safely to [facility] via [facility] from [facility].

## 2018-09-04 NOTE — Progress Notes (Signed)
Received report via phone from Bary Castilla from [facility] @ [phone].
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
## 2018-09-05 DIAGNOSIS — N179 Acute kidney failure, unspecified: Principal | ICD-10-CM

## 2018-09-05 DIAGNOSIS — S7002XA Contusion of left hip, initial encounter: Secondary | ICD-10-CM

## 2018-09-05 LAB — CBC
HCT: 33.4 % — ABNORMAL LOW (ref 36.0–46.0)
Hemoglobin: 10 g/dL — ABNORMAL LOW (ref 12.0–15.0)
MCH: 25.4 pg — ABNORMAL LOW (ref 26.0–34.0)
MCHC: 29.9 g/dL — AB (ref 30.0–36.0)
MCV: 84.8 fL (ref 80.0–100.0)
Platelets: 300 10*3/uL (ref 150–400)
RBC: 3.94 MIL/uL (ref 3.87–5.11)
RDW: 16.9 % — ABNORMAL HIGH (ref 11.5–15.5)
WBC: 10.7 10*3/uL — ABNORMAL HIGH (ref 4.0–10.5)
nRBC: 0 % (ref 0.0–0.2)

## 2018-09-05 LAB — BASIC METABOLIC PANEL
Anion gap: 6 (ref 5–15)
BUN: 56 mg/dL — ABNORMAL HIGH (ref 8–23)
CO2: 22 mmol/L (ref 22–32)
CREATININE: 1.77 mg/dL — AB (ref 0.44–1.00)
Calcium: 9.4 mg/dL (ref 8.9–10.3)
Chloride: 109 mmol/L (ref 98–111)
GFR calc Af Amer: 29 mL/min — ABNORMAL LOW (ref >60)
GFR calc non Af Amer: 25 mL/min — ABNORMAL LOW (ref >60)
Glucose, Bld: 116 mg/dL — ABNORMAL HIGH (ref 70–99)
Potassium: 3.9 mmol/L (ref 3.5–5.1)
Sodium: 137 mmol/L (ref 135–145)

## 2018-09-05 LAB — CK: Total CK: 1564 U/L — ABNORMAL HIGH (ref 38–234)

## 2018-09-05 MED ORDER — POLYETHYLENE GLYCOL 3350 17 G PO PACK
17.0000 g | PACK | Freq: Every day | ORAL | Status: DC
  Administered 2018-09-06 – 2018-09-07 (×2): 17 g via ORAL
  Filled 2018-09-05 (×2): qty 1

## 2018-09-05 MED ORDER — TRAMADOL HCL 50 MG PO TABS
50.0000 mg | ORAL_TABLET | Freq: Two times a day (BID) | ORAL | Status: DC | PRN
  Administered 2018-09-05: 100 mg via ORAL
  Filled 2018-09-05: qty 2

## 2018-09-05 MED ORDER — RIVAROXABAN 20 MG PO TABS
20.0000 mg | ORAL_TABLET | Freq: Every day | ORAL | Status: DC
  Administered 2018-09-05 – 2018-09-07 (×3): 20 mg via ORAL
  Filled 2018-09-05 (×3): qty 1

## 2018-09-05 MED ORDER — TRAMADOL HCL 50 MG PO TABS: 50.0000 mg | ORAL_TABLET | Freq: Four times a day (QID) | ORAL | Status: DC | PRN

## 2018-09-05 MED ORDER — PANTOPRAZOLE SODIUM 40 MG PO TBEC
40.0000 mg | DELAYED_RELEASE_TABLET | Freq: Every day | ORAL | Status: DC
  Administered 2018-09-05 – 2018-09-07 (×3): 40 mg via ORAL
  Filled 2018-09-05 (×2): qty 1

## 2018-09-05 MED ORDER — OXYCODONE HCL 5 MG PO TABS
5.0000 mg | ORAL_TABLET | Freq: Four times a day (QID) | ORAL | Status: DC | PRN
  Administered 2018-09-05 – 2018-09-07 (×6): 5 mg via ORAL
  Filled 2018-09-05 (×6): qty 1

## 2018-09-05 NOTE — Evaluation (Signed)
Physical Therapy Evaluation Patient Details Name: Brenda Schmidt MRN: [id number] DOB: 09/20/1928 Today's Date: 09/05/2018   History of Present Illness  85 y.o. female with medical history significant of hypertension, PE and DVT, chronic shoulder pain and history of right hip replacement who was brought in from outside hospital following mechanical fall with possible left hip fracture, UTI and rhabdomyolysis. With further imaging at [facility], no hip fx was found.   Clinical Impression  PTA pt lived alone in single story home with 3 railed steps to enter. She was independent in mobility with use of SPC, drives and able to perform ADLs, including bathing and dressing. Pt has a personal aide that assists with laundry, and cleaning and some cooking. Pt is currently limited in safe mobility by increased L hip pain and resulting decreased strength and ROM as well as 3/4 DoE with short distance ambulation. Pt requires min A for bed mobility, min guard for transfers and min A for ambulation of 20 feet with RW. PT recommends HHPT level rehab at d/c to improve strength, balance and endurance, with initial 24 hour supervision to insure safety. PT will continue to follow acutely.      Follow Up Recommendations Home health PT;Supervision/Assistance - 24 hour    Equipment Recommendations  Rolling walker with 5" wheels    Recommendations for Other Services OT consult     Precautions / Restrictions Precautions Precautions: Fall Restrictions Weight Bearing Restrictions: Yes LLE Weight Bearing: Weight bearing as tolerated      Mobility  Bed Mobility Overal bed mobility: Needs Assistance Bed Mobility: Supine to Sit     Supine to sit: Min assist     General bed mobility comments: min A for pad scoot fo hips to EoB and for trunk to upright, vc for hand placement on bed rail to assist in getting hips to EoB  Transfers Overall transfer level: Needs assistance Equipment used: Rolling walker (2  wheeled) Transfers: Sit to/from Stand Sit to Stand: Min guard         General transfer comment: min guard for safety, pt requires use of momentum to bring hips close enough to EoB and for power up to RW, good steadying with RW in standing   Ambulation/Gait Ambulation/Gait assistance: Min assist Gait Distance (Feet): 20 Feet Assistive device: Rolling walker (2 wheeled) Gait Pattern/deviations: Step-through pattern;Decreased weight shift to left;Decreased step length - right;Decreased step length - left;Shuffle;Trunk flexed Gait velocity: slowed Gait velocity interpretation: <1.8 ft/sec, indicate of risk for recurrent falls General Gait Details: minA for steadying with RW, vc for proximity to RW, and upright posture, pt with 3/4 DoE at end of ambulation   Stairs            Wheelchair Mobility    Modified Rankin (Stroke Patients Only)       Balance Overall balance assessment: Needs assistance Sitting-balance support: Feet supported;No upper extremity supported Sitting balance-Leahy Scale: Good     Standing balance support: Single extremity supported Standing balance-Leahy Scale: Poor Standing balance comment: requires RW assist                             Pertinent Vitals/Pain Pain Assessment: 0-10 Pain Score: 2  Pain Location: L hip, bilateral knee OA with inital movement Pain Descriptors / Indicators: Aching;Sore;Throbbing Pain Intervention(s): Limited activity within patient's tolerance;Monitored during session;Premedicated before session;Repositioned    Home Living Family/patient expects to be discharged to:: Private residence Living Arrangements: Alone  Available Help at Discharge: Personal care attendant;Available PRN/intermittently Type of Home: House Home Access: Stairs to enter Entrance Stairs-Rails: Can reach both Entrance Stairs-Number of Steps: 2 Home Layout: One level Home Equipment: Cane - single point;Toilet riser;Bedside commode       Prior Function Level of Independence: Independent with assistive device(s)         Comments: drives to store,      Hand Dominance        Extremity/Trunk Assessment   Upper Extremity Assessment Upper Extremity Assessment: Overall WFL for tasks assessed    Lower Extremity Assessment Lower Extremity Assessment: RLE deficits/detail;LLE deficits/detail RLE Deficits / Details: R knee AROM limited by OA, strength grossly assessed at 3+/5, in hip, knee and ankle RLE Sensation: WNL LLE Deficits / Details: L hip ROM limited by pain, L knee ROM limited by OA, strength grossly assessed from movement at 3+/5 LLE: Unable to fully assess due to pain LLE Sensation: WNL    Cervical / Trunk Assessment Cervical / Trunk Assessment: Kyphotic  Communication   Communication: HOH  Cognition Arousal/Alertness: Awake/alert Behavior During Therapy: WFL for tasks assessed/performed Overall Cognitive Status: Within Functional Limits for tasks assessed                                        General Comments General comments (skin integrity, edema, etc.): minor swelling and ecchymosis on L lateral hip        Assessment/Plan    PT Assessment Patient needs continued PT services  PT Problem List Decreased strength;Decreased range of motion;Decreased activity tolerance;Decreased balance;Decreased mobility;Pain       PT Treatment Interventions DME instruction;Gait training;Stair training;Functional mobility training;Therapeutic activities;Therapeutic exercise;Balance training;Patient/family education    PT Goals (Current goals can be found in the Care Plan section)  Acute Rehab PT Goals Patient Stated Goal: go home PT Goal Formulation: With patient Time For Goal Achievement: 09/19/18 Potential to Achieve Goals: Good    Frequency Min 5X/week   Barriers to discharge Decreased caregiver support         AM-PAC PT "6 Clicks" Mobility  Outcome Measure Help needed turning  from your back to your side while in a flat bed without using bedrails?: A Little Help needed moving from lying on your back to sitting on the side of a flat bed without using bedrails?: A Little Help needed moving to and from a bed to a chair (including a wheelchair)?: A Little Help needed standing up from a chair using your arms (e.g., wheelchair or bedside chair)?: A Little Help needed to walk in hospital room?: A Little Help needed climbing 3-5 steps with a railing? : A Lot 6 Click Score: 17    End of Session Equipment Utilized During Treatment: Gait belt Activity Tolerance: Patient tolerated treatment well Patient left: in chair;with call bell/phone within reach;with chair alarm set;with nursing/sitter in room Nurse Communication: Mobility status PT Visit Diagnosis: Unsteadiness on feet (R26.81);Other abnormalities of gait and mobility (R26.89);Muscle weakness (generalized) (M62.81);History of falling (Z91.81);Difficulty in walking, not elsewhere classified (R26.2);Pain Pain - Right/Left: Left Pain - part of body: Hip    Time: 4982-6415 PT Time Calculation (min) (ACUTE ONLY): 21 min   Charges:   PT Evaluation $PT Eval Moderate Complexity: 1 Mod          Bronsen Serano B. Beverely Risen PT, DPT Acute Rehabilitation Services Pager [phone] Office [phone]   Brenda Schmidt  Brenda Schmidt 09/05/2018, 10:47 AM

## 2018-09-05 NOTE — Progress Notes (Signed)
Patient ID: Brenda Schmidt, female   DOB: 1929-07-30, 85 y.o.   MRN: [id number] Patient had previous MRI done in [place] April 2019 that showed severe left foraminal stenosis at L3-4, moderate on the right at L3-4.  She also had severe stenosis at L4-5 centrally.  IF  patient has trouble mobilizing with physical therapy would recommend repeating lumbar MRI scan.  My cell phone [phone].

## 2018-09-05 NOTE — Progress Notes (Signed)
Foley catheter removed by RN @ 0730. Nursing will monitor for post-void.

## 2018-09-05 NOTE — Progress Notes (Signed)
PROGRESS NOTE    Brenda Schmidt   IRW:[id number]  DOB: [birth year]  DOA: 09/04/2018 PCP: Romeo Rabon, MD   Brief Narrative:  Brenda Schmidt is a 85 y.o. female with medical history significant of hypertension, PE and DVT, chronic shoulder pain and history of right hip replacement who was brought in from outside hospital with a suspected left hip fracture, UTI , AKI and rhabdomyolysis. She fell on the night of 09/01/18 on to her left hip, could not get up and was not found until 2 days later   Subjective: Pain in left hip. No other complaints.    Assessment & Plan:   Principal Problem:   Left hip pain - no acute fracture - per PT eval, can go home with HHPT-  lives alone at home  Active Problems:    Rhabdomyolysis   AKI (acute kidney injury)  - due to fall and being on the floor for about 2 days - need to continue IVF in the hospital as CK still quit high - Cr slightly improved from yesterday - baseline is 0.96- admitted with Cr of 1.92  H/o DVT/PE - as no surgery is planned, resume Xarelto  HTN - Amlodipine    GERD (gastroesophageal reflux disease) - cont PPI    UTI (urinary tract infection)? - she is asymptomatic- she usually has discomfort when she has a UTI- d/c Ceftriaxone  Anemia - likely is of chronic disease  Time spent in minutes: 40 ming DVT prophylaxis: Xarelto Code Status: Full code Family Communication:  Disposition Plan: home once AKI and Rhabdo noted to be improving Consultants:   Ortho Procedures:   none Antimicrobials:  Anti-infectives (From admission, onward)   Start     Dose/Rate Route Frequency Ordered Stop   09/04/18 0830  cefTRIAXone (ROCEPHIN) 1 g in sodium chloride 0.9 % 100 mL IVPB     1 g 200 mL/hr over 30 Minutes Intravenous Every 24 hours 09/04/18 0749         Objective: Vitals:   09/04/18 1431 09/04/18 1520 09/04/18 2032 09/05/18 0456  BP:   132/68 (!) 158/71  Pulse:   (!) 58 (!) 53  Resp:      Temp:  (!) 97.2 F (36.2 C)   (!) 97.5 F (36.4 C)  TempSrc:    Oral  SpO2:  93% 95% 97%    Intake/Output Summary (Last 24 hours) at 09/05/2018 0826 Last data filed at 09/05/2018 0500 Gross per 24 hour  Intake 1171.05 ml  Output 500 ml  Net 671.05 ml   There were no vitals filed for this visit.  Examination: General exam: Appears comfortable  HEENT: PERRLA, oral mucosa moist, no sclera icterus or thrush Respiratory system: Clear to auscultation. Respiratory effort normal. Cardiovascular system: S1 & S2 heard, RRR.   Gastrointestinal system: Abdomen soft, non-tender, nondistended. Normal bowel sounds. Central nervous system: Alert and oriented. No focal neurological deficits. Extremities: No cyanosis, clubbing - bruising and edema noted around left hip Skin: No rashes or ulcers- large bruise on left chest wall Psychiatry:  Mood & affect appropriate.     Data Reviewed: I have personally reviewed following labs and imaging studies  CBC: Recent Labs  Lab 09/04/18 0846 09/05/18 0207  WBC 14.1* 10.7*  HGB 11.7* 10.0*  HCT 40.3 33.4*  MCV 83.4 84.8  PLT 351 300   Basic Metabolic Panel: Recent Labs  Lab 09/04/18 0846 09/05/18 0207  NA 139 137  K 4.0 3.9  CL 108 109  CO2 20* 22  GLUCOSE 129* 116*  BUN 54* 56*  CREATININE 1.92* 1.77*  CALCIUM 9.9 9.4   GFR: CrCl cannot be calculated (Unknown ideal weight.). Liver Function Tests: Recent Labs  Lab 09/04/18 0846  AST 64*  ALT 32  ALKPHOS 65  BILITOT 0.7  PROT 5.6*  ALBUMIN 2.8*   No results for input(s): LIPASE, AMYLASE in the last 168 hours. No results for input(s): AMMONIA in the last 168 hours. Coagulation Profile: Recent Labs  Lab 09/04/18 0846  INR 1.22   Cardiac Enzymes: Recent Labs  Lab 09/04/18 0846 09/05/18 0207  CKTOTAL 1,760* 1,564*   BNP (last 3 results) No results for input(s): PROBNP in the last 8760 hours. HbA1C: No results for input(s): HGBA1C in the last 72 hours. CBG: No results for  input(s): GLUCAP in the last 168 hours. Lipid Profile: No results for input(s): CHOL, HDL, LDLCALC, TRIG, CHOLHDL, LDLDIRECT in the last 72 hours. Thyroid Function Tests: No results for input(s): TSH, T4TOTAL, FREET4, T3FREE, THYROIDAB in the last 72 hours. Anemia Panel: No results for input(s): VITAMINB12, FOLATE, FERRITIN, TIBC, IRON, RETICCTPCT in the last 72 hours. Urine analysis:    Component Value Date/Time   COLORURINE YELLOW 08/20/2013 0750   APPEARANCEUR CLEAR 08/20/2013 0750   LABSPEC 1.017 08/20/2013 0750   PHURINE 6.0 08/20/2013 0750   GLUCOSEU NEGATIVE 08/20/2013 0750   HGBUR LARGE (A) 08/20/2013 0750   BILIRUBINUR NEGATIVE 08/20/2013 0750   KETONESUR NEGATIVE 08/20/2013 0750   PROTEINUR NEGATIVE 08/20/2013 0750   UROBILINOGEN 0.2 08/20/2013 0750   NITRITE NEGATIVE 08/20/2013 0750   LEUKOCYTESUR NEGATIVE 08/20/2013 0750   Sepsis Labs: @LABRCNTIP (procalcitonin:4,lacticidven:4) ) Recent Results (from the past 240 hour(s))  Surgical pcr screen     Status: None   Collection Time: 09/04/18  9:40 AM  Result Value Ref Range Status   MRSA, PCR NEGATIVE NEGATIVE Final   Staphylococcus aureus NEGATIVE NEGATIVE Final    Comment: (NOTE) The Xpert SA Assay (FDA approved for NASAL specimens in patients 83 years of age and older), is one component of a comprehensive surveillance program. It is not intended to diagnose infection nor to guide or monitor treatment. Performed at [facility], [address] [place], [place] [address]          Radiology Studies: Dg Chest 1 View  Result Date: 09/04/2018 CLINICAL DATA:  Patient with left hip pain. EXAM: CHEST  1 VIEW COMPARISON:  Chest radiograph 11/15/2012 FINDINGS: Stable cardiomegaly. Aortic atherosclerosis. Moderate hiatal hernia. Bibasilar atelectasis. No pleural effusion or pneumothorax. Thoracic spine degenerative changes. IMPRESSION: Bibasilar atelectasis. Large hiatal hernia. Electronically Signed   By: Annia Beltrew   Davis M.D.   On: 09/04/2018 09:03   Dg Hip Unilat With Pelvis 2-3 Views Left  Result Date: 09/04/2018 CLINICAL DATA:  85 year old female with acute on chronic left hip pain and sciatica over the past few weeks. EXAM: DG HIP (WITH OR WITHOUT PELVIS) 2-3V LEFT COMPARISON:  Prior radiographs of the pelvis 09/06/2013 FINDINGS: The bones are diffusely osteopenic. Surgical changes of prior right hip arthroplasty. No evidence of acute fracture or malalignment. Minimal degenerative changes. No significant osteoarthritis. Atherosclerotic vascular calcifications visualized throughout the superficial femoral artery. No lytic or blastic osseous lesion. IMPRESSION: 1. No evidence of acute fracture or malalignment. 2. Minimal osteoarthritis. 3. Atherosclerotic vascular calcifications. Electronically Signed   By: Malachy MoanHeath  McCullough M.D.   On: 09/04/2018 09:17      Scheduled Meds: . amLODipine  10 mg Oral QAC breakfast  . docusate  sodium  100 mg Oral BID  . pantoprazole  40 mg Oral Daily  . rivaroxaban  20 mg Oral Q supper   Continuous Infusions: . sodium chloride 125 mL/hr at 09/04/18 1031  . cefTRIAXone (ROCEPHIN)  IV Stopped (09/04/18 1031)     LOS: 1 day      Calvert Cantor, MD [facility] Pager: [email or web address] Password [facility] 09/05/2018, 8:26 AM

## 2018-09-05 NOTE — Plan of Care (Signed)
  Problem: Education: Goal: Knowledge of General Education information will improve Description: Including pain rating scale, medication(s)/side effects and non-pharmacologic comfort measures Outcome: Progressing   Problem: Clinical Measurements: Goal: Will remain free from infection Outcome: Progressing   Problem: Safety: Goal: Ability to remain free from injury will improve Outcome: Progressing   

## 2018-09-06 ENCOUNTER — Other Ambulatory Visit: Payer: Self-pay

## 2018-09-06 LAB — CK: Total CK: 445 U/L — ABNORMAL HIGH (ref 38–234)

## 2018-09-06 LAB — BASIC METABOLIC PANEL
Anion gap: 8 (ref 5–15)
BUN: 56 mg/dL — ABNORMAL HIGH (ref 8–23)
CO2: 21 mmol/L — ABNORMAL LOW (ref 22–32)
Calcium: 9.6 mg/dL (ref 8.9–10.3)
Chloride: 105 mmol/L (ref 98–111)
Creatinine, Ser: 1.68 mg/dL — ABNORMAL HIGH (ref 0.44–1.00)
GFR calc Af Amer: 31 mL/min — ABNORMAL LOW (ref >60)
GFR, EST NON AFRICAN AMERICAN: 27 mL/min — AB (ref >60)
GLUCOSE: 103 mg/dL — AB (ref 70–99)
POTASSIUM: 3.8 mmol/L (ref 3.5–5.1)
Sodium: 134 mmol/L — ABNORMAL LOW (ref 135–145)

## 2018-09-06 LAB — URINE CULTURE: Culture: >=100000 — AB

## 2018-09-06 MED ORDER — BISACODYL 10 MG RE SUPP: 10.0000 mg | Freq: Every day | RECTAL | Status: DC | PRN

## 2018-09-06 MED ORDER — TRAMADOL HCL 50 MG PO TABS
50.0000 mg | ORAL_TABLET | Freq: Two times a day (BID) | ORAL | Status: DC | PRN
  Administered 2018-09-06: 100 mg via ORAL
  Filled 2018-09-06: qty 2

## 2018-09-06 MED ORDER — TRAMADOL HCL 50 MG PO TABS: 50.0000 mg | ORAL_TABLET | Freq: Four times a day (QID) | ORAL | Status: DC | PRN

## 2018-09-06 NOTE — Progress Notes (Signed)
PROGRESS NOTE    Brenda FramesMadeline Schmidt   ZOX:[id number]  DOB: 12/16/1928  DOA: 09/04/2018 PCP: Romeo Rabonaplan, Michael, MD   Brief Narrative:  Brenda Schmidt  Brenda Schmidt is a 85 y.o. female with medical history significant of hypertension, PE and DVT, chronic shoulder pain and history of right hip replacement who was brought in from outside hospital with a suspected left hip fracture, UTI , AKI and rhabdomyolysis. She fell on the night of 09/01/18 on to her left hip, could not get up and was not found until 2 days later   Subjective: She has no new complaints. On an off pain in left chest and left hip.    Assessment & Plan:   Principal Problem:   Left hip pain - no acute fracture - per PT eval, initially noted that she could go home with HHPT- today PT states she needs a SNF- social work consulted today for SNF-    Active Problems:    Rhabdomyolysis   AKI (acute kidney injury)  - due to fall and being on the floor for about 2 days - Rhabdomyolysis resolved- will d/c IVF- encourage oral fluids and follow up I and O - baseline is 0.96- admitted with Cr of 1.92 - Cr rise likely related to Rhabdomyolysis and prerenal state as she laid on the floor for days  - Cr improving daily - recheck tomorrow and will need to be followed as outpt to see if it returns to baseline   H/o DVT/PE - as no surgery is planned, resumed Xarelto yesterday  HTN - Amlodipine    GERD (gastroesophageal reflux disease) - cont PPI    UTI (urinary tract infection)? - she is asymptomatic- she usually has discomfort when she has a UTI- d/c Ceftriaxone  Anemia - likely is of chronic disease  Time spent in minutes: 30 min DVT prophylaxis: Xarelto Code Status: Full code Family Communication:  Disposition Plan: SNF when bed found. Consultants:   Ortho Procedures:   none Antimicrobials:  Anti-infectives (From admission, onward)   Start     Dose/Rate Route Frequency Ordered Stop   09/04/18 0830  cefTRIAXone  (ROCEPHIN) 1 g in sodium chloride 0.9 % 100 mL IVPB  Status:  Discontinued     1 g 200 mL/hr over 30 Minutes Intravenous Every 24 hours 09/04/18 0749 09/05/18 0835       Objective: Vitals:   09/05/18 0456 09/05/18 1144 09/06/18 0527 09/06/18 1115  BP: (!) 158/71 134/67 (!) 143/56 122/61  Pulse: (!) 53 80 (!) 57 (!) 55  Resp:  20 16 19   Temp: (!) 97.5 F (36.4 C) 98.6 F (37 C) 97.7 F (36.5 C) 98.2 F (36.8 C)  TempSrc: Oral  Oral   SpO2: 97% (!) 88% 94% 95%    Intake/Output Summary (Last 24 hours) at 09/06/2018 1216 Last data filed at 09/06/2018 0800 Gross per 24 hour  Intake 380 ml  Output 400 ml  Net -20 ml   There were no vitals filed for this visit.  Examination: General exam: Appears comfortable  HEENT: PERRLA, oral mucosa moist, no sclera icterus or thrush Respiratory system: Clear to auscultation. Respiratory effort normal. Cardiovascular system: S1 & S2 heard,  No murmurs  Gastrointestinal system: Abdomen soft, non-tender, nondistended. Normal bowel sound. No organomegaly Central nervous system: Alert and oriented. No focal neurological deficits. Extremities: No cyanosis, clubbing or edema Skin: No rashes or ulcers Psychiatry:  Mood & affect appropriate.   Data Reviewed: I have personally reviewed following labs and imaging  studies  CBC: Recent Labs  Lab 09/04/18 0846 09/05/18 0207  WBC 14.1* 10.7*  HGB 11.7* 10.0*  HCT 40.3 33.4*  MCV 83.4 84.8  PLT 351 300   Basic Metabolic Panel: Recent Labs  Lab 09/04/18 0846 09/05/18 0207 09/06/18 0404  NA 139 137 134*  K 4.0 3.9 3.8  CL 108 109 105  CO2 20* 22 21*  GLUCOSE 129* 116* 103*  BUN 54* 56* 56*  CREATININE 1.92* 1.77* 1.68*  CALCIUM 9.9 9.4 9.6   GFR: CrCl cannot be calculated (Unknown ideal weight.). Liver Function Tests: Recent Labs  Lab 09/04/18 0846  AST 64*  ALT 32  ALKPHOS 65  BILITOT 0.7  PROT 5.6*  ALBUMIN 2.8*   No results for input(s): LIPASE, AMYLASE in the last 168  hours. No results for input(s): AMMONIA in the last 168 hours. Coagulation Profile: Recent Labs  Lab 09/04/18 0846  INR 1.22   Cardiac Enzymes: Recent Labs  Lab 09/04/18 0846 09/05/18 0207 09/06/18 0404  CKTOTAL 1,760* 1,564* 445*   BNP (last 3 results) No results for input(s): PROBNP in the last 8760 hours. HbA1C: No results for input(s): HGBA1C in the last 72 hours. CBG: No results for input(s): GLUCAP in the last 168 hours. Lipid Profile: No results for input(s): CHOL, HDL, LDLCALC, TRIG, CHOLHDL, LDLDIRECT in the last 72 hours. Thyroid Function Tests: No results for input(s): TSH, T4TOTAL, FREET4, T3FREE, THYROIDAB in the last 72 hours. Anemia Panel: No results for input(s): VITAMINB12, FOLATE, FERRITIN, TIBC, IRON, RETICCTPCT in the last 72 hours. Urine analysis:    Component Value Date/Time   COLORURINE YELLOW 08/20/2013 0750   APPEARANCEUR CLEAR 08/20/2013 0750   LABSPEC 1.017 08/20/2013 0750   PHURINE 6.0 08/20/2013 0750   GLUCOSEU NEGATIVE 08/20/2013 0750   HGBUR LARGE (A) 08/20/2013 0750   BILIRUBINUR NEGATIVE 08/20/2013 0750   KETONESUR NEGATIVE 08/20/2013 0750   PROTEINUR NEGATIVE 08/20/2013 0750   UROBILINOGEN 0.2 08/20/2013 0750   NITRITE NEGATIVE 08/20/2013 0750   LEUKOCYTESUR NEGATIVE 08/20/2013 0750   Sepsis Labs: @LABRCNTIP (procalcitonin:4,lacticidven:4) ) Recent Results (from the past 240 hour(s))  Urine culture     Status: Abnormal (Preliminary result)   Collection Time: 09/04/18  9:24 AM  Result Value Ref Range Status   Specimen Description URINE, CATHETERIZED  Final   Special Requests NONE  Final   Culture (A)  Final    >=100,000 COLONIES/mL ESCHERICHIA COLI SUSCEPTIBILITIES TO FOLLOW Performed at [facility], [address], [place], [place] [address]    Report Status PENDING  Incomplete  Surgical pcr screen     Status: None   Collection Time: 09/04/18  9:40 AM  Result Value Ref Range Status   MRSA, PCR NEGATIVE NEGATIVE  Final   Staphylococcus aureus NEGATIVE NEGATIVE Final    Comment: (NOTE) The Xpert SA Assay (FDA approved for NASAL specimens in patients 26 years of age and older), is one component of a comprehensive surveillance program. It is not intended to diagnose infection nor to guide or monitor treatment. Performed at [facility], [address] [place], [place] [address]          Radiology Studies: No results found.    Scheduled Meds: . amLODipine  10 mg Oral QAC breakfast  . docusate sodium  100 mg Oral BID  . pantoprazole  40 mg Oral Daily  . polyethylene glycol  17 g Oral Daily  . rivaroxaban  20 mg Oral Q supper   Continuous Infusions: . sodium  chloride Stopped (09/05/18 1633)     LOS: 2 days      Calvert CantorSaima Tyrhonda Georgiades, MD [facility] Pager: [email or web address] Password [email or web address] 09/06/2018, 12:16 PM

## 2018-09-06 NOTE — Progress Notes (Signed)
Pt alert, able to make needs known. States her pain is in her flank area. Medicated as needed. Ambulated to bathroom with rolling walker and 1 assist. Pt noted to be [facility] after activity. Weak non productive cough noted. MD made aware. Pt agreeable to work with therapy.

## 2018-09-06 NOTE — Plan of Care (Signed)
  Problem: Education: Goal: Knowledge of General Education information will improve Description Including pain rating scale, medication(s)/side effects and non-pharmacologic comfort measures Outcome: Progressing   Problem: Clinical Measurements: Goal: Will remain free from infection Outcome: Progressing   Problem: Clinical Measurements: Goal: Diagnostic test results will improve Outcome: Progressing   Problem: Safety: Goal: Ability to remain free from injury will improve Outcome: Progressing

## 2018-09-06 NOTE — [place] FL2 (Signed)
[place] MEDICAID FL2 LEVEL OF CARE SCREENING TOOL     IDENTIFICATION  Patient Name: Brenda Schmidt Birthdate: 08/14/1929 Sex: female Admission Date (Current Location): 09/04/2018  [facility] [place]   Facility and Address:  [facility], [address], [place], [place] [address]      Provider Number: [id number]  Attending Physician Name and Address:  Calvert Cantorizwan, Saima, MD  Relative Name and Phone Number:  Doreatha MartinSam (brother) [phone]    Current Level of Care: Hospital Recommended Level of Care: Skilled Nursing Facility Prior Approval Number:    Date Approved/Denied:   PASRR Number: (pending)  Discharge Plan: SNF    Current Diagnoses: Patient Active Problem List   Diagnosis Date Noted  . AKI (acute kidney injury) (HCC) 09/05/2018  . Hip fracture (HCC) 09/04/2018  . UTI (urinary tract infection) 09/04/2018  . Rhabdomyolysis 09/04/2018  . Contusion of hip, left 09/04/2018  . History of total right hip arthroplasty 12/13/2017  . Muscle spasm 12/08/2012  . Essential hypertension, benign 12/08/2012  . Anemia 12/08/2012  . GERD (gastroesophageal reflux disease) 12/08/2012  . Other and unspecified hyperlipidemia 12/08/2012  . Degenerative arthritis of hip 11/25/2012    Orientation RESPIRATION BLADDER Height & Weight     Self, Time, Situation, Place  O2(nasal cannula 2L/min) Continent, External catheter Weight:   Height:     BEHAVIORAL SYMPTOMS/MOOD NEUROLOGICAL BOWEL NUTRITION STATUS      Continent Diet(see discharge summary)  AMBULATORY STATUS COMMUNICATION OF NEEDS Skin   Limited Assist Verbally Other (Comment)(ecchymosis on chest and hip)                       Personal Care Assistance Level of Assistance  Bathing, Feeding, Dressing, Total care Bathing Assistance: Limited assistance Feeding assistance: Independent Dressing Assistance: Limited assistance Total Care Assistance: Limited assistance   Functional  Limitations Info  Sight, Hearing, Speech Sight Info: Impaired Hearing Info: Impaired Speech Info: Adequate    SPECIAL CARE FACTORS FREQUENCY  PT (By licensed PT), OT (By licensed OT)     PT Frequency: min 5x weekly OT Frequency: min 5x weekly            Contractures Contractures Info: Not present    Additional Factors Info  Code Status, Allergies Code Status Info: full Allergies Info: no active allergies           Current Medications (09/06/2018):  This is the current hospital active medication list Current Facility-Administered Medications  Medication Dose Route Frequency Provider Last Rate Last Dose  . 0.9 %  sodium chloride infusion   Intravenous Continuous Calvert Cantorizwan, Saima, MD   Stopped at 09/05/18 1633  . acetaminophen (TYLENOL) tablet 650 mg  650 mg Oral Q6H PRN Dorcas CarrowGhimire, Kuber, MD   650 mg at 09/06/18 0809   Or  . acetaminophen (TYLENOL) suppository 650 mg  650 mg Rectal Q6H PRN Dorcas CarrowGhimire, Kuber, MD      . amLODipine (NORVASC) tablet 10 mg  10 mg Oral QAC breakfast Dorcas CarrowGhimire, Kuber, MD   10 mg at 09/06/18 0809  . bisacodyl (DULCOLAX) suppository 10 mg  10 mg Rectal Daily PRN Calvert Cantorizwan, Saima, MD      . docusate sodium (COLACE) capsule 100 mg  100 mg Oral BID Dorcas CarrowGhimire, Kuber, MD   100 mg at 09/06/18 0809  . oxyCODONE (Oxy IR/ROXICODONE) immediate release tablet 5 mg  5 mg Oral Q6H PRN Calvert Cantorizwan, Saima, MD   5 mg at 09/05/18 2100  .  pantoprazole (PROTONIX) EC tablet 40 mg  40 mg Oral Daily Calvert Cantorizwan, Saima, MD   40 mg at 09/06/18 0810  . polyethylene glycol (MIRALAX / GLYCOLAX) packet 17 g  17 g Oral Daily Calvert Cantorizwan, Saima, MD   17 g at 09/06/18 0810  . polyvinyl alcohol (LIQUIFILM TEARS) 1.4 % ophthalmic solution 1 drop  1 drop Both Eyes TID PRN Dorcas CarrowGhimire, Kuber, MD      . promethazine (PHENERGAN) tablet 12.5 mg  12.5 mg Oral Q6H PRN Dorcas CarrowGhimire, Kuber, MD      . rivaroxaban (XARELTO) tablet 20 mg  20 mg Oral Q supper Calvert Cantorizwan, Saima, MD   20 mg at 09/05/18 1356  . traMADol (ULTRAM) tablet 50-100  mg  50-100 mg Oral Q12H PRN Calvert Cantorizwan, Saima, MD         Discharge Medications: Please see discharge summary for a list of discharge medications.  Relevant Imaging Results:  Relevant Lab Results:   Additional Information [id number]  Gildardo GriffesAshley M Lennox Leikam, LCSW

## 2018-09-06 NOTE — Progress Notes (Signed)
Pt was given tramadol, even though it was listed as an allergy. Pt had no reaction to medication. No hives, itching or swelling. Continue to monitor.

## 2018-09-06 NOTE — Progress Notes (Signed)
   Subjective:    Patient reports pain as moderate.  Hip pain less. Ambulated 20 ft with assistance with  PT.   Objective: Vital signs in last 24 hours: Temp:  [97.7 F (36.5 C)-98.6 F (37 C)] 97.7 F (36.5 C) (01/14 0527) Pulse Rate:  [57-80] 57 (01/14 0527) Resp:  [16-20] 16 (01/14 0527) BP: (134-143)/(56-67) 143/56 (01/14 0527) SpO2:  [88 %-94 %] 94 % (01/14 0527)  Intake/Output from previous day: 01/13 0701 - 01/14 0700 In: 480 [P.O.:480] Out: 400 [Urine:400] Intake/Output this shift: No intake/output data recorded.  Recent Labs    09/04/18 0846 09/05/18 0207  HGB 11.7* 10.0*   Recent Labs    09/04/18 0846 09/05/18 0207  WBC 14.1* 10.7*  RBC 4.83 3.94  HCT 40.3 33.4*  PLT 351 300   Recent Labs    09/05/18 0207 09/06/18 0404  NA 137 134*  K 3.9 3.8  CL 109 105  CO2 22 21*  BUN 56* 56*  CREATININE 1.77* 1.68*  GLUCOSE 116* 103*  CALCIUM 9.4 9.6   Recent Labs    09/04/18 0846  INR 1.22    Neurologically intact No results found.  Assessment/Plan:    Up with therapy, she can follow up with me in [facility] in 2 wks.   Eldred Manges 09/06/2018, 7:59 AM

## 2018-09-06 NOTE — Progress Notes (Signed)
Physical Therapy Treatment Patient Details Name: Brenda Schmidt MRN: [id number] DOB: 03/19/1929 Today's Date: 09/06/2018    History of Present Illness 85 y.o. female with medical history significant of hypertension, PE and DVT, chronic shoulder pain and history of right hip replacement who was brought in from outside hospital following mechanical fall with possible left hip fracture, UTI and rhabdomyolysis. With further [facility] at [facility], no hip fx was found.     PT Comments    Pt agreeable to working with therapy to complete stair training in order to go home, however pt is limited by 4/4 DoE and mild oxygen desaturation (see General Comments) and resultant generalized weakness with with short distance ambulation and stair training. Pt is currently supervision for bed mobility and min guard for transfers and minA for 10 feet of ambulation and moderate assist for ascent/descent of 2 steps. PT discussed the need for 24 hour care at discharge to maintain her safety, however pt is not sure that her brother can provide that care. Given pt was admitted to hospital s/p a fall and given pt's increased fatigue and weakness with mobility, with decreased support, PT changing recommendation for d/c location to SNF. Pt is in agreement with this decision and states she went to rehab after her shoulder surgery and was happy with the increased opportunity for rehab.    Follow Up Recommendations  SNF     Equipment Recommendations  Rolling walker with 5" wheels    Recommendations for Other Services OT consult     Precautions / Restrictions Precautions Precautions: Fall Restrictions Weight Bearing Restrictions: Yes LLE Weight Bearing: Weight bearing as tolerated    Mobility  Bed Mobility Overal bed mobility: Needs Assistance Bed Mobility: Supine to Sit     Supine to sit: Supervision     General bed mobility comments: supervision for coming to EoB, increased time and effort  required  Transfers Overall transfer level: Needs assistance Equipment used: Rolling walker (2 wheeled) Transfers: Sit to/from Stand Sit to Stand: Min guard         General transfer comment: min guard for safety, pt requires use of momentum to bring hips close enough to EoB and for power up to RW, vc for push off from bed instead of reaching up to RW good steadying with RW in standing   Ambulation/Gait Ambulation/Gait assistance: Min guard Gait Distance (Feet): 10 Feet Assistive device: Rolling walker (2 wheeled) Gait Pattern/deviations: Step-through pattern;Decreased weight shift to left;Decreased step length - right;Decreased step length - left;Shuffle;Trunk flexed Gait velocity: slowed Gait velocity interpretation: <1.8 ft/sec, indicate of risk for recurrent falls General Gait Details: min guard for safety, 3/4 DoE with limited disatnce of ambulation    Stairs Stairs: Yes Stairs assistance: Mod assist Stair Management: Two rails;Forwards;Step to pattern Number of Stairs: 2 General stair comments: modA for physical assist to ascend steps and to keep upright with descent of steps, pt with 4/4 DoE with stair training          Balance Overall balance assessment: Needs assistance Sitting-balance support: Feet supported;No upper extremity supported Sitting balance-Leahy Scale: Good     Standing balance support: Single extremity supported Standing balance-Leahy Scale: Poor Standing balance comment: requires RW assist                            Cognition Arousal/Alertness: Awake/alert Behavior During Therapy: WFL for tasks assessed/performed Overall Cognitive Status: Within Functional Limits for tasks assessed  General Comments General comments (skin integrity, edema, etc.): Pt on 2L O2 via [place] at entry with SaO2 of 93%O2, with completion of stair training pt with 4/4 DoE, and SaO2 dropped to 89%O2,  supplemental O2 returned via [place] and SaO2 rebouded to 92%O2.       Pertinent Vitals/Pain Pain Assessment: 0-10 Pain Score: 2  Pain Location: L hip, bilateral knee OA with inital movement Pain Descriptors / Indicators: Aching;Sore;Throbbing Pain Intervention(s): Limited activity within patient's tolerance;Monitored during session;Repositioned    Home Living Family/patient expects to be discharged to:: Private residence Living Arrangements: Alone                      PT Goals (current goals can now be found in the care plan section) Acute Rehab PT Goals Patient Stated Goal: go home PT Goal Formulation: With patient Time For Goal Achievement: 09/19/18 Potential to Achieve Goals: Good Progress towards PT goals: Progressing toward goals    Frequency    Min 3X/week      PT Plan Discharge plan needs to be updated;Frequency needs to be updated    Co-evaluation              AM-PAC PT "6 Clicks" Mobility   Outcome Measure  Help needed turning from your back to your side while in a flat bed without using bedrails?: A Little Help needed moving from lying on your back to sitting on the side of a flat bed without using bedrails?: A Little Help needed moving to and from a bed to a chair (including a wheelchair)?: A Little Help needed standing up from a chair using your arms (e.g., wheelchair or bedside chair)?: A Little Help needed to walk in hospital room?: A Little Help needed climbing 3-5 steps with a railing? : A Lot 6 Click Score: 17    End of Session Equipment Utilized During Treatment: Gait belt Activity Tolerance: Patient tolerated treatment well Patient left: in chair;with call bell/phone within reach;with chair alarm set;with nursing/sitter in room Nurse Communication: Mobility status PT Visit Diagnosis: Unsteadiness on feet (R26.81);Other abnormalities of gait and mobility (R26.89);Muscle weakness (generalized) (M62.81);History of falling (Z91.81);Difficulty  in walking, not elsewhere classified (R26.2);Pain Pain - Right/Left: Left Pain - part of body: Hip     Time: 4599-7741 PT Time Calculation (min) (ACUTE ONLY): 24 min  Charges:  $Gait Training: 23-37 mins                     Brilynn Biasi B. Beverely Risen PT, DPT Acute Rehabilitation Services Pager [phone] Office [phone]    Elon Alas Fleet 09/06/2018, 10:06 AM

## 2018-09-07 LAB — CK: Total CK: 274 U/L — ABNORMAL HIGH (ref 38–234)

## 2018-09-07 LAB — BASIC METABOLIC PANEL
ANION GAP: 10 (ref 5–15)
BUN: 45 mg/dL — ABNORMAL HIGH (ref 8–23)
CO2: 20 mmol/L — ABNORMAL LOW (ref 22–32)
Calcium: 10 mg/dL (ref 8.9–10.3)
Chloride: 107 mmol/L (ref 98–111)
Creatinine, Ser: 1.48 mg/dL — ABNORMAL HIGH (ref 0.44–1.00)
GFR calc Af Amer: 37 mL/min — ABNORMAL LOW (ref >60)
GFR calc non Af Amer: 32 mL/min — ABNORMAL LOW (ref >60)
Glucose, Bld: 103 mg/dL — ABNORMAL HIGH (ref 70–99)
POTASSIUM: 3.9 mmol/L (ref 3.5–5.1)
Sodium: 137 mmol/L (ref 135–145)

## 2018-09-07 MED ORDER — OXYCODONE HCL 5 MG PO TABS: 5.0000 mg | ORAL_TABLET | Freq: Four times a day (QID) | ORAL | 0 refills | Status: AC | PRN

## 2018-09-07 MED ORDER — IPRATROPIUM BROMIDE 0.02 % IN SOLN: 0.5000 mg | Freq: Four times a day (QID) | RESPIRATORY_TRACT | Status: DC | PRN

## 2018-09-07 MED ORDER — IPRATROPIUM BROMIDE HFA 17 MCG/ACT IN AERS: 2.0000 | INHALATION_SPRAY | Freq: Four times a day (QID) | RESPIRATORY_TRACT | Status: DC

## 2018-09-07 MED ORDER — IPRATROPIUM BROMIDE 0.02 % IN SOLN
0.5000 mg | Freq: Four times a day (QID) | RESPIRATORY_TRACT | Status: DC
  Administered 2018-09-07: 0.5 mg via RESPIRATORY_TRACT

## 2018-09-07 MED ORDER — FLUTICASONE PROPIONATE 50 MCG/ACT NA SUSP
2.0000 | Freq: Every day | NASAL | Status: DC
  Administered 2018-09-07: 2 via NASAL
  Filled 2018-09-07: qty 16

## 2018-09-07 NOTE — Clinical Social Work Note (Signed)
Clinical Social Work Assessment  Patient Details  Name: Brenda Schmidt MRN: [id number] Date of Birth: 1929/02/11  Date of referral:  09/07/18               Reason for consult:  Discharge Planning                Permission sought to share information with:  Case Manager, Facility Medical sales representative, Family Supports Permission granted to share information::  Yes, Verbal Permission Granted  Name::     [name]  Agency::  SNFs  Relationship::  son  Contact Information:  [phone]  Housing/Transportation Living arrangements for the past 2 months:  Single Family Home Source of Information:  Patient Patient Interpreter Needed:  None Criminal Activity/Legal Involvement Pertinent to Current Situation/Hospitalization:  No - Comment as needed Significant Relationships:  Adult Children Lives with:  Self Do you feel safe going back to the place where you live?  No Need for family participation in patient care:  Yes (Comment)  Care giving concerns:  CSW received referral for possible SNF placement at time of discharge. Spoke with patient regarding possibility of SNF placement . Patient's son is currently unable to care for her at their home given patient's current needs and fall risk.  Patient and son at Bedside expressed understanding of PT recommendation and are agreeable to SNF placement at time of discharge. CSW to continue to follow and assist with discharge planning needs.     Social Worker assessment / plan:  Spoke with patient and     concerning possibility of rehab at SNF before returning home.    Employment status:  Retired Health and safety inspector:  Medicare PT Recommendations:  Skilled Nursing Facility Information / Referral to community resources:  Skilled Nursing Facility  Patient/Family's Response to care:  Patient and  Son at bedside recognize need for rehab before returning home and are agreeable to a SNF in [place]. They report preference for [facility] as patient has been  there in 2014      . CSW explained insurance authorization process. Patient's family reported that they want patient to get stronger to be able to come back home.    Patient/Family's Understanding of and Emotional Response to Diagnosis, Current Treatment, and Prognosis:  Patient/family is realistic regarding therapy needs and expressed being hopeful for SNF placement. Patient expressed understanding of CSW role and discharge process as well as medical condition. No questions/concerns about plan or treatment.   Emotional Assessment Appearance:  Appears stated age Attitude/Demeanor/Rapport:  Gracious Affect (typically observed):  Accepting Orientation:  Oriented to Self, Oriented to Place, Oriented to  Time, Oriented to Situation Alcohol / Substance use:  Not Applicable Psych involvement (Current and /or in the community):  No (Comment)  Discharge Needs  Concerns to be addressed:  Discharge Planning Concerns Readmission within the last 30 days:  No Current discharge risk:  Dependent with Mobility Barriers to Discharge:  Continued Medical Work up   [employer], LCSW 09/07/2018, 10:51 AM

## 2018-09-07 NOTE — Care Management Important Message (Signed)
Important Message  Patient Details  Name: Brenda Schmidt MRN: [id number] Date of Birth: 11-19-1928   Medicare Important Message Given:  Yes    Madalynne Gutmann 09/07/2018, 2:32 PM

## 2018-09-07 NOTE — Plan of Care (Signed)
  Problem: Clinical Measurements: Goal: Will remain free from infection Outcome: Progressing   Problem: Education: Goal: Knowledge of General Education information will improve Description Including pain rating scale, medication(s)/side effects and non-pharmacologic comfort measures Outcome: Progressing   Problem: Health Behavior/Discharge Planning: Goal: Ability to manage health-related needs will improve Outcome: Progressing   Problem: Clinical Measurements: Goal: Ability to maintain clinical measurements within normal limits will improve Outcome: Progressing Goal: Will remain free from infection Outcome: Progressing Goal: Diagnostic test results will improve Outcome: Progressing Goal: Respiratory complications will improve Outcome: Progressing Goal: Cardiovascular complication will be avoided Outcome: Progressing   Problem: Activity: Goal: Risk for activity intolerance will decrease Outcome: Progressing   Problem: Nutrition: Goal: Adequate nutrition will be maintained Outcome: Progressing   Problem: Coping: Goal: Level of anxiety will decrease Outcome: Progressing   Problem: Elimination: Goal: Will not experience complications related to bowel motility Outcome: Progressing Goal: Will not experience complications related to urinary retention Outcome: Progressing   Problem: Safety: Goal: Ability to remain free from injury will improve Outcome: Progressing   Problem: Skin Integrity: Goal: Risk for impaired skin integrity will decrease Outcome: Progressing

## 2018-09-07 NOTE — Progress Notes (Signed)
Attempted to call report to SNF. There was no answer. Will try again soon.

## 2018-09-07 NOTE — Progress Notes (Signed)
Pt alert, oriented. Able to make needs known. Hard of hearing, visiting with family at bedside. Up to chair at this time. States that her right arm is hurting. MD made aware. Continue to monitor.

## 2018-09-07 NOTE — Clinical Social Work Placement (Signed)
   CLINICAL SOCIAL WORK PLACEMENT  NOTE  Date:  09/07/2018  Patient Details  Name: Brenda Schmidt MRN: [id number] Date of Birth: 07/02/1929  Clinical Social Work is seeking post-discharge placement for this patient at the Skilled  Nursing Facility level of care (*CSW will initial, date and re-position this form in  chart as items are completed):      Patient/family provided with [facility] Clinical Social Work Department's list of facilities offering this level of care within the geographic area requested by the patient (or if unable, by the patient's family).  Yes   Patient/family informed of their freedom to choose among providers that offer the needed level of care, that participate in Medicare, Medicaid or managed care program needed by the patient, have an available bed and are willing to accept the patient.      Patient/family informed of [facility]'s ownership interest in [facility] and [facility], as well as of the fact that they are under no obligation to receive care at these facilities.  PASRR submitted to EDS on       PASRR number received on 09/07/18     Existing PASRR number confirmed on       FL2 transmitted to all facilities in geographic area requested by pt/family on 09/07/18     FL2 transmitted to all facilities within larger geographic area on       Patient informed that his/her managed care company has contracts with or will negotiate with certain facilities, including the following:        Yes   Patient/family informed of bed offers received.  Patient chooses bed at [facility]     Physician recommends and patient chooses bed at      Patient to be transferred to [facility] on 09/07/18.  Patient to be transferred to facility by [facility]     Patient family notified on 09/07/18 of transfer.  Name of family member notified:  [name]     PHYSICIAN       Additional Comment:    _______________________________________________ Gildardo Griffes,  LCSW 09/07/2018, 1:09 PM

## 2018-09-07 NOTE — Discharge Summary (Signed)
Physician Discharge Summary  Brenda Schmidt ZOX:[id number] DOB: Feb 19, 1929 DOA: 09/04/2018  PCP: Romeo Rabon, MD  Admit date: 09/04/2018 Discharge date: 09/07/2018  Admitted From: Home Disposition:  SNF  Recommendations for Outpatient Follow-up:  1. Follow up with PCP in 1 week 2. Follow up with Dr. Ophelia Charter in [place] in 2 weeks for left hip injury  3. Repeat BMP in 1 week to ensure resolution of Cr. Cr trending down 1.92 --> 1.48   Discharge Condition: Stable CODE STATUS: Full  Diet recommendation: Heart healthy   Brief/Interim Summary: Brenda Schmidt is a 85 yo female with past medical history significant for HTN, PE and DVT, chronic shoulder pain with previous right rotator cuff repair, history of right hip replacement who was transferred to [facility] with suspected left hip fx, UTI, AKI, rhabdomyolysis. She fell on night of 09/01/2018 on her left hip, could not get up and was found 2 days later. Imaging found no acute fracture. She was evaluated by orthopedic surgery.   Left hip pain without acute fracture PT recommending SNF placement with continued rehab Follow up with Dr. Ophelia Charter, orthopedic surgery in 2 weeks   Acute kidney injury with rhabdomyolysis Discontinued IV fluids, creatinine continues to improve.  CK trending downward  History of DVT and PE Continue Xarelto  Essential hypertension Continue amlodipine  Asymptomatic bacteriuria She has no symptoms of dysuria, pelvic pressure, hematuria Ceftriaxone has been discontinued  Right arm pain Occurred after nurse tech helped her get up from bed to assist to the bathroom last night.  She states that the tech grabbed her arm and pulled too hard.  On examination, she does have exquisite tenderness to palpation in right bicep tendon.  No shoulder or elbow joint pain or discomfort or acute abnormality.  Continue to monitor, supportive care, physical therapy  Discharge Instructions  Discharge Instructions    Diet - low  sodium heart healthy   Complete by:  As directed    Increase activity slowly   Complete by:  As directed      Allergies as of 09/07/2018   No Active Allergies     Medication List    STOP taking these medications   gabapentin 100 MG capsule Commonly known as:  NEURONTIN   HYDROcodone-acetaminophen 5-325 MG tablet Commonly known as:  NORCO/VICODIN   ondansetron 8 MG disintegrating tablet Commonly known as:  ZOFRAN ODT   oxyCODONE-acetaminophen 5-325 MG tablet Commonly known as:  PERCOCET/ROXICET   phenazopyridine 200 MG tablet Commonly known as:  PYRIDIUM   traMADol 50 MG tablet Commonly known as:  ULTRAM     TAKE these medications   acetaminophen 500 MG tablet Commonly known as:  TYLENOL Take 500 mg by mouth every 6 (six) hours as needed for mild pain.   amLODipine 10 MG tablet Commonly known as:  NORVASC Take 10 mg by mouth daily before breakfast.   atorvastatin 20 MG tablet Commonly known as:  LIPITOR Take 20 mg by mouth daily before breakfast.   fenofibrate 160 MG tablet Take 160 mg by mouth every evening.   ferrous fumarate 325 (106 Fe) MG Tabs tablet Commonly known as:  HEMOCYTE - 106 mg FE Take 2 tablets by mouth daily.   omeprazole 40 MG capsule Commonly known as:  PRILOSEC Take 40 mg by mouth daily.   oxyCODONE 5 MG immediate release tablet Commonly known as:  Oxy IR/ROXICODONE Take 1 tablet (5 mg total) by mouth every 6 (six) hours as needed for up to 3 days  for breakthrough pain.   vitamin B-12 1000 MCG tablet Commonly known as:  CYANOCOBALAMIN Take 1,000 mcg by mouth daily.   Vitamin D (Ergocalciferol) 1.25 MG (50000 UT) Caps capsule Commonly known as:  DRISDOL Take 50,000 Units by mouth every Monday, Wednesday, and Friday.   XARELTO 20 MG Tabs tablet Generic drug:  rivaroxaban Take 20 mg by mouth daily with supper.      Follow-up Information    Brenda Schmidt, Brenda C, MD Follow up in 2 week(s).   Specialty:  Orthopedic Surgery Why:  call  this number to make appt in about 2 to [facility] wks [facility] on a Thursday with Dr. Sheryle HailYates  Contact information: [address] [place] [address] [phone]        Romeo Rabonaplan, Michael, MD. Schedule an appointment as soon as possible for a visit.   Specialty:  Internal Medicine Why:  see PCP on Friday and have the following blood work performed: [employer] Contact information: [address] [place] [address] [phone]          No Active Allergies  Consultations:  Orthopedic surgery    Procedures/Studies: Dg Chest 1 View  Result Date: 09/04/2018 CLINICAL DATA:  Patient with left hip pain. EXAM: CHEST  1 VIEW COMPARISON:  Chest radiograph 11/15/2012 FINDINGS: Stable cardiomegaly. Aortic atherosclerosis. Moderate hiatal hernia. Bibasilar atelectasis. No pleural effusion or pneumothorax. Thoracic spine degenerative changes. IMPRESSION: Bibasilar atelectasis. Large hiatal hernia. Electronically Signed   By: Brenda Schmidt  Davis M.D.   On: 09/04/2018 09:03   Dg Hip Unilat With Pelvis 2-3 Views Left  Result Date: 09/04/2018 CLINICAL DATA:  85 year old female with acute on chronic left hip pain and sciatica over the past few weeks. EXAM: DG HIP (WITH OR WITHOUT PELVIS) 2-3V LEFT COMPARISON:  Prior radiographs of the pelvis 09/06/2013 FINDINGS: The bones are diffusely osteopenic. Surgical changes of prior right hip arthroplasty. No evidence of acute fracture or malalignment. Minimal degenerative changes. No significant osteoarthritis. Atherosclerotic vascular calcifications visualized throughout the superficial femoral artery. No lytic or blastic osseous lesion. IMPRESSION: 1. No evidence of acute fracture or malalignment. 2. Minimal osteoarthritis. 3. Atherosclerotic vascular calcifications. Electronically Signed   By: Brenda Schmidt  McCullough M.D.   On: 09/04/2018 09:17      Discharge Exam: Vitals:   09/06/18 2056 09/07/18 0338  BP: (!) 149/56 (!) 149/63  Pulse: 64 63  Resp: 18  16  Temp: 98.4 F (36.9 Schmidt) 98 F (36.7 Schmidt)  SpO2: 93% 96%    General: Pt is alert, awake, not in acute distress Cardiovascular: RRR, S1/S2 +, no rubs, no gallops Respiratory: CTA bilaterally, no wheezing, no rhonchi Abdominal: Soft, NT, ND, bowel sounds + Extremities: no edema, no cyanosis    The results of significant diagnostics from this hospitalization (including imaging, microbiology, ancillary and laboratory) are listed below for reference.     Microbiology: Recent Results (from the past 240 hour(s))  Urine culture     Status: Abnormal   Collection Time: 09/04/18  9:24 AM  Result Value Ref Range Status   Specimen Description URINE, CATHETERIZED  Final   Special Requests   Final    NONE Performed at [facility], [address] [place], [place] [address]    Culture >=100,000 COLONIES/mL ESCHERICHIA COLI (A)  Final   Report Status 09/06/2018 FINAL  Final   Organism ID, Bacteria ESCHERICHIA COLI (A)  Final      Susceptibility   Escherichia coli - MIC*    AMPICILLIN  4 SENSITIVE Sensitive     CEFAZOLIN <=4 SENSITIVE Sensitive     CEFTRIAXONE <=1 SENSITIVE Sensitive     CIPROFLOXACIN <=0.25 SENSITIVE Sensitive     GENTAMICIN <=1 SENSITIVE Sensitive     IMIPENEM <=0.25 SENSITIVE Sensitive     NITROFURANTOIN <=16 SENSITIVE Sensitive     TRIMETH/SULFA <=20 SENSITIVE Sensitive     AMPICILLIN/SULBACTAM <=2 SENSITIVE Sensitive     PIP/TAZO <=4 SENSITIVE Sensitive     Extended ESBL NEGATIVE Sensitive     * >=100,000 COLONIES/mL ESCHERICHIA COLI  Surgical pcr screen     Status: None   Collection Time: 09/04/18  9:40 AM  Result Value Ref Range Status   MRSA, PCR NEGATIVE NEGATIVE Final   Staphylococcus aureus NEGATIVE NEGATIVE Final    Comment: (NOTE) The Xpert SA Assay (FDA approved for NASAL specimens in patients 44 years of age and older), is one component of a comprehensive surveillance program. It is not intended to diagnose infection nor to guide or monitor  treatment. Performed at [facility], [address], [place], [place] [address]      Labs: BNP (last 3 results) No results for input(s): BNP in the last 8760 hours. Basic Metabolic Panel: Recent Labs  Lab 09/04/18 0846 09/05/18 0207 09/06/18 0404 09/07/18 0146  NA 139 137 134* 137  K 4.0 3.9 3.8 3.9  CL 108 109 105 107  CO2 20* 22 21* 20*  GLUCOSE 129* 116* 103* 103*  BUN 54* 56* 56* 45*  CREATININE 1.92* 1.77* 1.68* 1.48*  CALCIUM 9.9 9.4 9.6 10.0   Liver Function Tests: Recent Labs  Lab 09/04/18 0846  AST 64*  ALT 32  ALKPHOS 65  BILITOT 0.7  PROT 5.6*  ALBUMIN 2.8*   No results for input(s): LIPASE, AMYLASE in the last 168 hours. No results for input(s): AMMONIA in the last 168 hours. CBC: Recent Labs  Lab 09/04/18 0846 09/05/18 0207  WBC 14.1* 10.7*  HGB 11.7* 10.0*  HCT 40.3 33.4*  MCV 83.4 84.8  PLT 351 300   Cardiac Enzymes: Recent Labs  Lab 09/04/18 0846 09/05/18 0207 09/06/18 0404 09/07/18 0146  CKTOTAL 1,760* 1,564* 445* 274*   BNP: Invalid input(s): POCBNP CBG: No results for input(s): GLUCAP in the last 168 hours. D-Dimer No results for input(s): DDIMER in the last 72 hours. Hgb A1c No results for input(s): HGBA1C in the last 72 hours. Lipid Profile No results for input(s): CHOL, HDL, LDLCALC, TRIG, CHOLHDL, LDLDIRECT in the last 72 hours. Thyroid function studies No results for input(s): TSH, T4TOTAL, T3FREE, THYROIDAB in the last 72 hours.  Invalid input(s): FREET3 Anemia work up No results for input(s): VITAMINB12, FOLATE, FERRITIN, TIBC, IRON, RETICCTPCT in the last 72 hours. Urinalysis    Component Value Date/Time   COLORURINE YELLOW 08/20/2013 0750   APPEARANCEUR CLEAR 08/20/2013 0750   LABSPEC 1.017 08/20/2013 0750   PHURINE 6.0 08/20/2013 0750   GLUCOSEU NEGATIVE 08/20/2013 0750   HGBUR LARGE (A) 08/20/2013 0750   BILIRUBINUR NEGATIVE 08/20/2013 0750   KETONESUR NEGATIVE 08/20/2013 0750   PROTEINUR  NEGATIVE 08/20/2013 0750   UROBILINOGEN 0.2 08/20/2013 0750   NITRITE NEGATIVE 08/20/2013 0750   LEUKOCYTESUR NEGATIVE 08/20/2013 0750   Sepsis Labs Invalid input(s): PROCALCITONIN,  WBC,  LACTICIDVEN Microbiology Recent Results (from the past 240 hour(s))  Urine culture     Status: Abnormal   Collection Time: 09/04/18  9:24 AM  Result Value Ref Range Status   Specimen Description URINE, CATHETERIZED  Final  Special Requests   Final    NONE Performed at [facility], [address] [place], [place] [address]    Culture >=100,000 COLONIES/mL ESCHERICHIA COLI (A)  Final   Report Status 09/06/2018 FINAL  Final   Organism ID, Bacteria ESCHERICHIA COLI (A)  Final      Susceptibility   Escherichia coli - MIC*    AMPICILLIN 4 SENSITIVE Sensitive     CEFAZOLIN <=4 SENSITIVE Sensitive     CEFTRIAXONE <=1 SENSITIVE Sensitive     CIPROFLOXACIN <=0.25 SENSITIVE Sensitive     GENTAMICIN <=1 SENSITIVE Sensitive     IMIPENEM <=0.25 SENSITIVE Sensitive     NITROFURANTOIN <=16 SENSITIVE Sensitive     TRIMETH/SULFA <=20 SENSITIVE Sensitive     AMPICILLIN/SULBACTAM <=2 SENSITIVE Sensitive     PIP/TAZO <=4 SENSITIVE Sensitive     Extended ESBL NEGATIVE Sensitive     * >=100,000 COLONIES/mL ESCHERICHIA COLI  Surgical pcr screen     Status: None   Collection Time: 09/04/18  9:40 AM  Result Value Ref Range Status   MRSA, PCR NEGATIVE NEGATIVE Final   Staphylococcus aureus NEGATIVE NEGATIVE Final    Comment: (NOTE) The Xpert SA Assay (FDA approved for NASAL specimens in patients 83 years of age and older), is one component of a comprehensive surveillance program. It is not intended to diagnose infection nor to guide or monitor treatment. Performed at [facility], [address] [place], [place] [address]      Patient was seen and examined on the day of discharge and was found to be in stable condition. Time coordinating discharge: 40 minutes including assessment and  coordination of care, as well as examination of the patient.   SIGNED:  Noralee StainJennifer Ayush Boulet, DO [facility] [email or web address] 09/07/2018, 10:04 AM

## 2018-09-07 NOTE — Progress Notes (Signed)
PASRR number received:  [id number] [place], [place] [phone]

## 2018-09-07 NOTE — Progress Notes (Signed)
Attempted to call report to [facility] regarding pt.

## 2018-09-07 NOTE — Progress Notes (Signed)
Pt alert, able to make needs known. Denies pain at this time. Sitting up in recliner. Pleasant, no acute distress. Pt set to discharge to SNF via [facility].

## 2018-09-07 NOTE — Progress Notes (Signed)
Patient will DC to: [facility] Anticipated DC date:09/07/2018 Family notified: [name] by: Sharin MonsPTAR  Per MD patient ready for DC to [facility]. RN, patient, patient's family, and facility notified of DC. Discharge Summary sent to facility. RN given number for report [phone] Room 509P .DC packet on chart. Ambulance transport requested for patient for 2:30 pm per facility request when room will be ready. CSW signing off.  HurdlandAshley Analucia [place], [place] [phone]

## 2018-09-12 ENCOUNTER — Encounter (INDEPENDENT_AMBULATORY_CARE_PROVIDER_SITE_OTHER): Payer: Self-pay | Admitting: Physical Medicine and Rehabilitation

## 2019-05-08 ENCOUNTER — Other Ambulatory Visit: Payer: Self-pay

## 2019-05-08 ENCOUNTER — Encounter: Payer: Self-pay | Admitting: Physician Assistant

## 2019-05-08 ENCOUNTER — Ambulatory Visit (INDEPENDENT_AMBULATORY_CARE_PROVIDER_SITE_OTHER): Payer: Medicare Other | Admitting: Physician Assistant

## 2019-05-08 VITALS — Ht 62.0 in | Wt 182.0 lb

## 2019-05-08 DIAGNOSIS — M48062 Spinal stenosis, lumbar region with neurogenic claudication: Secondary | ICD-10-CM | POA: Diagnosis not present

## 2019-05-08 DIAGNOSIS — M25551 Pain in right hip: Secondary | ICD-10-CM | POA: Diagnosis not present

## 2019-05-08 MED ORDER — TRAMADOL HCL 50 MG PO TABS: 50.0000 mg | ORAL_TABLET | Freq: Four times a day (QID) | ORAL | 0 refills | Status: DC | PRN
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
## 2019-05-15 NOTE — Addendum Note (Signed)
Addended by: Lendon Collar on: 05/15/2019 10:22 AM   Modules accepted: Orders

## 2019-05-23 ENCOUNTER — Telehealth: Payer: Self-pay

## 2019-05-23 ENCOUNTER — Other Ambulatory Visit: Payer: Self-pay | Admitting: Physician Assistant

## 2019-05-23 MED ORDER — TRAMADOL HCL 50 MG PO TABS: 50.0000 mg | ORAL_TABLET | Freq: Four times a day (QID) | ORAL | 0 refills | Status: DC | PRN

## 2019-05-23 NOTE — Telephone Encounter (Signed)
Refill request for Tramadol  [facility]

## 2019-06-01 ENCOUNTER — Encounter: Payer: Medicare Other | Admitting: Physical Medicine and Rehabilitation

## 2019-06-02 NOTE — Discharge Summary (Signed)
 ------------------------------------------------------------------------------- Attestation signed by [place] Velma Hamilton, MD at 06/02/19 1203 I saw and evaluated the patient, participating in the key portions of the service.  I reviewed the resident's note.  I agree with the resident's findings and plan. I spent less than 30 minutes in the discharge of this patient.  Velma GORMAN [place], MD  -------------------------------------------------------------------------------    Cardiology Discharge Summary  Identifying Information: Brenda Schmidt [birth year] [id number]   Admit Date: 05/31/2019 Discharge Date: 06/02/2019  Discharge Service: Cardiology [facility]) Discharge Attending Physician: Velma Hamilton [place], MD Primary Care Physician: MADELIN ANON, FNP   Discharge Diagnoses:  Principal Diagnosis: Pulmonary Embolism  Secondary Diagnoses: History of PE Hypertension Hyperlipidemia CKD  [ ]  Transfer Back to [facility] Course: Laqueshia May Schoenberger is a 85 y.o. female with a PMHx of priorPE (previously on rivaroxaban ), OA,TIA, CKD,nephrolithiasis,and HTN who was admitted on 9/29 for subacute dyspneaand adry cough, transferred from [facility] on 10/7 for EKOS consideration of her pulmonary embolism. Hospital course listed below in problem based format. Patient to be transferred to [facility].  Saddle PE+ R LE DVT  Hx of EZ:Empnm PE/DVT (? 62yr ago) on Xarelto , presented to OSH with SOB, thought to have pna on CT scan and a UTI, given antibiotics, Xarelto  held on admission. Subsequent worsening of hypoxia with e/o R heart strain on TEE (obtained 2/2 trop and BNP elevation): showedEF ~60-65%, G2DD, mild TR, moderate RV dilatation, and a RV echodensity suspicious for thrombus.CTA 10/5 demonstrated bilat PE w/ saddle embolus, HDS at the time. Initially restarted on Xarelto  but subsequently changed to heparin  gtt. She was transferred to [facility] in the CICU 10/7 for consideration of  EKOS. Determined not be a candidate, transferred out of CICU to Cardiology service. Regarding anticoagulation, hematology/oncology consulted to weigh in. They recommended appropriate to restart xarelto . Heparin  drip stopped on 10/9, and xarelto  restarted. Xarelto  from 10/9 should be 15 mg BID for 3 weeks, and then 20 mg daily for 3 months at least. They [facility] 3 months. Note that patient does have history of CAD based on CT scan, but given bleed risk, should not be on aspirin  or plavix  while on xarelto  administration. Aspirin  monotherapy can be reconsidered if she gets to a point where she is not on anticoagulation. She should have follow up scheduled with heme/onc to guide oral a/c timeline.   ?History of R-sided pulmonary nodules: Per CareEverywhere, there has been mention of pulmonary nodules in the right lung dating back to at least 2014.She denies any significant tobacco use and also denies being diagnosedwithCOPD (despiteOSH report and herhome med list with montelukast and bronchodilators). CT Chest overread by [facility] radiology to look at pulm nodules  RXI:AjdzopwzRm thought to be~1.2-1.3.Initial admit w/ AKI, now stable around 1.04.   Hypertension:  Continued home amlodipine  10 mg daily. Held home olmesartan -hctz, and terazosin  on admission.  Hyperlipidemia: various doses of atorvastatin  listed in chart (10mg  vs. 20mg  vs. 80mg ). LDL 32. Atorvastatin  continued at 20 mg daily    Procedures: No admission procedures for hospital encounter.  Discharge Day Services: BP 174/61   Pulse 55   Temp 36.3 C (Oral)   Resp 20   Ht 160 cm (5' 2.99)   Wt 88.2 kg (194 lb 7.1 oz)   SpO2 97%   BMI 34.45 kg/m  Pt seen on the day of discharge and determined appropriate for discharge.  Condition at Discharge: stable  Discharge Medications:   Your Medication List  STOP taking these medications   aspirin  81 MG tablet Commonly known as: ECOTRIN    clopidogreL  75 mg tablet Commonly known as: PLAVIX    fenofibrate  160 MG tablet Commonly known as: LOFIBRA   HYDROcodone -acetaminophen  5-325 mg per tablet Commonly known as: NORCO   terazosin  2 MG capsule Commonly known as: HYTRIN    traMADoL  50 mg tablet Commonly known as: ULTRAM      CHANGE how you take these medications   rivaroxaban  15 mg Tab Commonly known as: XARELTO  Take 1 tablet (15 mg total) by mouth 2 (two) times a day with meals for 19 days. Take until 06/21/19. Starting 06/22/19 take 20 mg once daily with food What changed:  medication strength how much to take when to take this additional instructions   rivaroxaban  20 mg tablet Commonly known as: XARELTO  Take 1 tablet (20 mg total) by mouth daily with evening meal. Starting 06/22/19. Take AFTER complete rivaroxaban  15 mg twice daily on 06/21/19 Start taking on: June 22, 2019 What changed: You were already taking a medication with the same name, and this prescription was added. Make sure you understand how and when to take each.     CONTINUE taking these medications   amLODIPine  5 MG tablet Commonly known as: NORVASC  Take 10 mg by mouth daily.   atorvastatin  20 MG tablet Commonly known as: LIPITOR Take 20 mg by mouth daily.   cyanocobalamin 1000 MCG tablet Take 1,000 mcg by mouth daily.   ferrous gluconate 324 MG tablet Take 324 mg by mouth daily with breakfast.   ipratropium 17 mcg/actuation inhaler Commonly known as: ATROVENT  HFA Inhale 2 puffs Every six (6) hours.   montelukast 10 mg tablet Commonly known as: SINGULAIR Take 10 mg by mouth nightly.   olmesartan -hydrochlorothiazide  40-12.5 mg per tablet Commonly known as: BENICAR  HCT Take 1 tablet by mouth daily.   omeprazole  40 MG capsule Commonly known as: PriLOSEC  Take 40 mg by mouth daily.   VITAMIN D2 1,250 mcg (50,000 unit) capsule Generic drug: ergocalciferol  Take 50,000 Units by mouth Every Monday, Wednesday, and Friday.        Recent Labs: Microbiology Results (last day)    ** No results found for the last 24 hours. **     Lab Results  Component Value Date   WBC 12.2 (H) 06/02/2019   HGB 10.8 (L) 06/02/2019   HCT 35.3 (L) 06/02/2019   PLT 275 06/02/2019   Lab Results  Component Value Date   NA 132 (L) 06/02/2019   K 4.8 06/02/2019   CL 104 06/02/2019   CO2 24.0 06/02/2019   BUN 28 (H) 06/02/2019   CREATININE 1.04 (H) 06/02/2019   CALCIUM  9.8 06/02/2019   MG 2.1 06/02/2019   Lab Results  Component Value Date   ALKPHOS 82 05/31/2019   BILITOT 0.8 05/31/2019   PROT 4.8 (L) 05/31/2019   ALBUMIN 2.5 (L) 05/31/2019   ALT 22 05/31/2019   AST 33 05/31/2019   Lab Results  Component Value Date   APTT 200.7 (HH) 06/02/2019   Radiology: Xr Chest Portable  Result Date: 06/01/2019 EXAM: XR CHEST PORTABLE DATE: 06/01/2019 12:12 AM ACCESSION: [id number] UN DICTATED: 06/01/2019 8:31 AM INTERPRETATION LOCATION: Main [facility] CLINICAL INDICATION: 85 years old Female with DYSPNEA  COMPARISON: None TECHNIQUE: Portable Chest Radiograph. FINDINGS: Patchy airspace opacity in the right lower lung zone. Small left pleural effusion and associated atelectasis versus consolidation. Unremarkable cardiomediastinal silhouette.   Patchy airspace opacities in both lower lung zones, concerning for pneumonia. Correlate for aspiration.  Pvl Venous Duplex Lower Extremity Bilateral  Result Date: 06/01/2019  Peripheral Vascular Lab     [address]   [place], [place] [address]  PVL VENOUS DUPLEX LOWER EXTREMITY BILATERAL Patient Demographics Pt. Name: Ioma MAY Caruthers Location: PVL Inpatient Bedside MRN:      [id number]      Sex:      F DOB:      Sep 22, 1928          Age:      85 years  Study Information Authorizing         [id number] REDELL SQUIBB [facility] Performed Time       06/01/2019 9:10:26 Provider Name                                                  AM Ordering Physician  Charleen REDELL         Patient Location     [facility] Accession  Number    [id number] UN         Technologist         Maire Molly RVT Diagnosis:                                Assisting            Harlene Puna,                                           Technologist         Extern Ordered Reason For Exam: check for DVT Indication: PE Risk Factors: Confirmed PE. Anticoagulation: (Heparin ). Protocol The major deep veins from the inguinal ligament to the ankle are assessed for bilaterally for compressibility and spectral Doppler flow characteristics. The assessed veins include bilateral common femoral vein, femoral vein in the thigh, popliteal vein, and intramuscular calf veins. The iliac vein is assessed indirectly using Doppler waveform analysis. The great saphenous vein is assessed for compressibility at the saphenofemoral junction, and the small saphenous vein assessed for compressibility behind the knee.  Right Duplex Findings The right popliteal vein is partially compressible and appears softly echogenic, spongy with compression and dilated. The right soleal vein is non compressible and appears softly echogenic and dilated. All other veins visualized appear fully compressible and demonstrate appropriate Doppler characteristics.  Left Duplex Findings All veins visualized appear fully compressible. Doppler flow signals demonstrate normal spontaneity, phasicity, and augmentation.  Right Technical Summary Evidence of acute obstruction in the right popliteal vein. Evidence of acute obstruction in the right soleal vein. All other veins visualized appear fully compressible and demonstrate appropriate Doppler characteristics.  Left Technical Summary No evidence of deep venous obstruction in the lower extremity. No indirect evidence of obstruction proximal to the inguinal ligament.  Final Interpretation Right There is evidence of acute DVT in the proximal veins, and intramuscular calf veins. Left There is no evidence of DVT in the lower extremity. There is no evidence of obstruction  proximal to the inguinal ligament or in the common femoral vein.  Electronically signed by [id number] Almeda Dolores MD on 06/01/2019 at 11:57:06 AM.   Xr Outside Film For Continued Care  Result Date: 06/01/2019 These images were imported  for continued care purposes only. They will not be interpreted and no charges will apply.  Ct Body Outside Film For Continued Care  Result Date: 06/01/2019 These images were imported for continued care purposes only. They will not be interpreted and no charges will apply.  Ct Body Outside Film For Continued Care  Result Date: 06/01/2019 These images were imported for continued care purposes only. They will not be interpreted and no charges will apply.   Discharge Instructions:        Length of Discharge: I spent greater than 30 mins in the discharge of this patient.

## 2020-02-21 ENCOUNTER — Ambulatory Visit: Payer: Medicare Other | Admitting: Orthopaedic Surgery

## 2020-03-06 ENCOUNTER — Ambulatory Visit (INDEPENDENT_AMBULATORY_CARE_PROVIDER_SITE_OTHER): Payer: Medicare Other | Admitting: Orthopaedic Surgery

## 2020-03-06 ENCOUNTER — Ambulatory Visit (INDEPENDENT_AMBULATORY_CARE_PROVIDER_SITE_OTHER): Payer: Medicare Other

## 2020-03-06 ENCOUNTER — Other Ambulatory Visit: Payer: Self-pay

## 2020-03-06 ENCOUNTER — Encounter: Payer: Self-pay | Admitting: Orthopaedic Surgery

## 2020-03-06 DIAGNOSIS — G8929 Other chronic pain: Secondary | ICD-10-CM

## 2020-03-06 DIAGNOSIS — M1712 Unilateral primary osteoarthritis, left knee: Secondary | ICD-10-CM

## 2020-03-06 DIAGNOSIS — M25562 Pain in left knee: Secondary | ICD-10-CM | POA: Diagnosis not present

## 2020-03-06 DIAGNOSIS — M65332 Trigger finger, left middle finger: Secondary | ICD-10-CM | POA: Diagnosis not present

## 2020-03-06 MED ORDER — METHYLPREDNISOLONE ACETATE 40 MG/ML IJ SUSP
40.0000 mg | INTRAMUSCULAR | Status: AC | PRN
  Administered 2020-03-06: 40 mg via INTRA_ARTICULAR

## 2020-03-06 MED ORDER — LIDOCAINE HCL 1 % IJ SOLN
3.0000 mL | INTRAMUSCULAR | Status: AC | PRN
  Administered 2020-03-06: 3 mL

## 2020-03-06 MED ORDER — METHYLPREDNISOLONE ACETATE 40 MG/ML IJ SUSP
20.0000 mg | INTRAMUSCULAR | Status: AC | PRN
  Administered 2020-03-06: 20 mg

## 2020-03-06 MED ORDER — LIDOCAINE HCL 1 % IJ SOLN
0.5000 mL | INTRAMUSCULAR | Status: AC | PRN
  Administered 2020-03-06: .5 mL
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
## 2022-06-22 NOTE — Progress Notes (Signed)
 [facility] Office Visit  Patient Name: Brenda Schmidt, Female Date of Birth:  [birth year], 88 y.o. Medical Record # [id number] Date:   06/22/2022  Assessment & Plan CKD could be related to diuretics and PPIs.  Creatinine improved from 2.0 to 1.3,  GFR 37 ml/min. Diuretics has been reduced and PPIs stop. Hypertension Hyperlipidemia History of pulmonary embolism on anticoagulation History of kidney stones Hypercalcemia, mild , could be from HCTZ and Vit D2 meds., SPEP negative, PTH normal at 68  Vit D2 level is normal 80. 7.   Renal sonogram 06/2021 - medical renal disease with small subcentimeter renal cysts and nonobstructing stones.   Plan Continue current medications Continue furosemide  20 mg Mondays and Fridays, may take extra if there is more swelling than usual. Continue famotidine, avoid proton pump inhibitors Avoid volume depletion Avoid taking calcium  tablets or vitamin D  supplementation as serum calcium  is mildly elevated. Avoid NSAIDs and other nephrotoxic meds      History of Present Illness Brenda Schmidt is a 88 y.o. female with hypertension, diastolic heart failure, GERD, whom I'm seeing for the first time on Jul 03, 2021 with a creatinine of 2.0.  His urinalysis was unremarkable.  Renal sonogram showed medical renal disease.  I decreased her furosemide  from 20 mg daily to just twice a week.  I also stopped her excellent and changed to famotidine.  With the changes her serum creatinine improved to 1.3 and has remained stable at that.  She does have ankle edema more on the left ankle but they're stable rate she denies any PND or orthopnea.  Her GERD symptoms is controlled on famotidine.  She is here together with a family member.  Has been no infections or hospitalization.  She has no urine problems.  She is not eating as much as before she had lost 6 pounds in 6 months.  Also have some insomnia but on melatonin and Benadryl  when necessary  The following portions  of the patient's chart were reviewed in this encounter and updated as appropriate:      Past Medical History Past Medical History:  Diagnosis Date  . Calculus of kidney   . Chronic kidney disease   . Essential hypertension   . Pulmonary embolism [facility])     Past Surgical History No past surgical history on file.  Family History Family History  Problem Relation Age of Onset  . Stroke Father   . Cancer Brother   . Heart disease Brother     Social History Social History   Tobacco Use  . Smoking status: Never  . Smokeless tobacco: Never  Substance Use Topics  . Alcohol  use: Never     Review of Systems  Constitutional:  Negative for chills and fever.  HENT:  Negative for congestion, ear pain, hearing loss and sore throat.   Eyes:  Negative for pain and discharge.  Respiratory:  Negative for cough, shortness of breath and wheezing.   Cardiovascular:  Negative for chest pain, palpitations and leg swelling.  Gastrointestinal:  Negative for abdominal pain, blood in stool, constipation, diarrhea, nausea and vomiting.  Genitourinary:  Negative for dysuria, frequency, hematuria and urgency.  Musculoskeletal:  Negative for back pain, myalgias and neck pain.  Skin:  Negative for rash.  Neurological:  Negative for dizziness, tremors and headaches.  Endo/Heme/Allergies:  Negative for polydipsia. Does not bruise/bleed easily.    Medication List Current Outpatient Medications  Medication Sig Dispense Refill  . acetaminophen  (TYLENOL  8 HOUR) 650 MG 8  hr tablet Take 1,300 mg by mouth every 4 (four) to 6 (six) hours if needed    . amLODIPine  (NORVASC ) 10 MG tablet Take 10 mg by mouth 1 (one) time each day    . ascorbic acid (VITAMIN C) 500 MG tablet Take 500 mg by mouth 1 (one) time each day    . atorvastatin  (LIPITOR) 20 MG tablet Take 20 mg by mouth 1 (one) time each day    . cetirizine (ZyrTEC) 10 MG tablet Take 10 mg by mouth 1 (one) time each day    . cyanocobalamin (VITAMIN  B-12) 1000 MCG tablet Take 1 tablet by mouth 1 (one) time each day    . ergocalciferol  1.25 MG (50000 UT) capsule Take 50,000 Units by mouth 1 (one) time per week    . fenofibrate  (TRIGLIDE ) 160 MG tablet Take 160 mg by mouth 1 (one) time each day    . ferrous sulfate 325 (65 Fe) MG tablet Take 325 mg by mouth 1 (one) time each day with breakfast    . Fluticasone  Propionate, Inhal, 50 MCG/ACT aerosol powder  Administer 1 puff into affected nostril(s) 1 (one) time each day if needed    . Fluticasone -Salmeterol 250-50 MCG/ACT aerosol powder   1 inh, inhale, BID, # 60 EA, 2 Refill(s), Pharmacy: [facility] [phone], 158, cm, 11/22/20 13:49:00 EDT, Height/Length Measured    . furosemide  (LASIX ) 20 MG tablet Take 1 tablet (20 mg total) by mouth every Monday and Friday 24 tablet 1  . lidocaine  (LIDODERM ) 5 % patch Apply 1 patch topically 1 (one) time each day if needed    . Multiple Vitamins-Minerals (Multivitamin Adults 50+) tablet Take 1 tablet by mouth 1 (one) time each day    . olmesartan -hydroCHLOROthiazide  (BENICAR  HCT) 40-12.5 MG per tablet Take 1 tablet by mouth in the morning.    . omeprazole  (PriLOSEC ) 20 MG DR capsule Take 20 mg by mouth 1 (one) time each day Do not crush or chew.    . rivaroxaban  (XARELTO ) 20 MG tablet Take 20 mg by mouth 1 (one) time each day with breakfast    . sucralfate  (CARAFATE ) 1 g tablet Take 1 tablet by mouth in the morning and 1 tablet at noon and 1 tablet in the evening and 1 tablet before bedtime.    . terazosin  (HYTRIN ) 1 MG capsule Take 1 capsule by mouth every night     No current facility-administered medications for this visit.     Allergy List No Known Allergies  Physical Exam There were no vitals filed for this visit.  Vitals reviewed. Constitutional: She is oriented to person, place, and time. She does not appear ill. No distress.  Eyes: Conjunctivae are normal. Right eye exhibits no discharge. Left eye exhibits no discharge. No scleral  icterus.  Neck: No thyroid mass and no thyromegaly present.  Cardiovascular:  Normal rate and regular rhythm.      Exam reveals no friction rub.      No murmur heard.She exhibits edema.  Pulmonary/Chest: Effort normal and breath sounds normal. No respiratory distress. She has no wheezes. She has no rales.  Abdominal: Soft. There is no abdominal tenderness. No hernia.  Musculoskeletal: Normal range of motion. She exhibits no deformity.  Neurological: She is alert and oriented to person, place, and time.  Skin: Skin is warm and dry. No rash noted. She is not diaphoretic. No erythema.  Psychiatric: She has a normal mood and affect. Her behavior is normal. Judgment normal.  Labs Chemistry  Lab Units 06/15/22 1355 11/03/21 1354 08/07/21 1515 07/03/21 1707 02/26/21 1435  CREATININE mg/dL 8.63* 8.62* 8.40* 8.30* 2.01*  BUN mg/dL 21 18 20 28  38*  POTASSIUM mmol/L 4.2 4.3 4.8 4.3 4.7  SODIUM mmol/L 143 142 143 142 141  CO2 mmol/L 20 20 20 20  18*  CHLORIDE mmol/L 107* 107* 108* 109* 108*  ALBUMIN g/dL 4.0 4.1 3.8 4.0  3.5 4.2  EGFR mL/min/1.73 37* 36* 31* 29* 23*  WBC AUTO x10E3/uL  --   --   --   --  8.2  HEMATOCRIT %  --   --   --   --  36.9  HEMOGLOBIN g/dL  --   --   --   --  87.5  PLATELETS AUTO x10E3/uL  --   --   --   --  300    Bone Mineral  Lab Units 06/15/22 1355 11/03/21 1354 08/07/21 1515 07/03/21 1707 02/26/21 1435  CALCIUM  mg/dL 89.4* 89.1* 89.3* 89.4* 10.9*  PHOSPHORUS mg/dL 3.2 3.4 2.9* 2.9*  --   ALK PHOS IU/L  --   --   --   --  77  PTH pg/mL  --  63  --  68*  --   VIT D 25 HYDROXY ng/mL  --  50.3  --  80.2  --               No LOS data to display    Ida KATHEE Sprung, MD

## 2022-09-14 ENCOUNTER — Ambulatory Visit (INDEPENDENT_AMBULATORY_CARE_PROVIDER_SITE_OTHER): Payer: Medicare Other

## 2022-09-14 ENCOUNTER — Other Ambulatory Visit: Payer: Self-pay

## 2022-09-14 ENCOUNTER — Ambulatory Visit (INDEPENDENT_AMBULATORY_CARE_PROVIDER_SITE_OTHER): Payer: Medicare Other | Admitting: Orthopaedic Surgery

## 2022-09-14 DIAGNOSIS — M25562 Pain in left knee: Secondary | ICD-10-CM

## 2022-09-14 DIAGNOSIS — G8929 Other chronic pain: Secondary | ICD-10-CM | POA: Diagnosis not present

## 2022-09-14 DIAGNOSIS — M5441 Lumbago with sciatica, right side: Secondary | ICD-10-CM | POA: Diagnosis not present

## 2022-09-14 DIAGNOSIS — M5442 Lumbago with sciatica, left side: Secondary | ICD-10-CM

## 2022-09-14 DIAGNOSIS — M65332 Trigger finger, left middle finger: Secondary | ICD-10-CM | POA: Diagnosis not present

## 2022-09-14 MED ORDER — METHYLPREDNISOLONE ACETATE 40 MG/ML IJ SUSP
40.0000 mg | INTRAMUSCULAR | Status: AC | PRN
  Administered 2022-09-14: 40 mg

## 2022-09-14 MED ORDER — METHYLPREDNISOLONE ACETATE 40 MG/ML IJ SUSP
40.0000 mg | INTRAMUSCULAR | Status: AC | PRN
  Administered 2022-09-14: 40 mg via INTRA_ARTICULAR

## 2022-09-14 MED ORDER — LIDOCAINE HCL 1 % IJ SOLN
3.0000 mL | INTRAMUSCULAR | Status: AC | PRN
  Administered 2022-09-14: 3 mL

## 2022-09-14 MED ORDER — LIDOCAINE HCL 1 % IJ SOLN
0.5000 mL | INTRAMUSCULAR | Status: AC | PRN
  Administered 2022-09-14: .5 mL
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
## 2022-09-24 ENCOUNTER — Ambulatory Visit: Payer: Self-pay

## 2022-09-24 ENCOUNTER — Ambulatory Visit (INDEPENDENT_AMBULATORY_CARE_PROVIDER_SITE_OTHER): Payer: Medicare Other | Admitting: Physical Medicine and Rehabilitation

## 2022-09-24 VITALS — BP 161/76 | HR 70

## 2022-09-24 DIAGNOSIS — M5416 Radiculopathy, lumbar region: Secondary | ICD-10-CM

## 2022-09-24 MED ORDER — METHYLPREDNISOLONE ACETATE 80 MG/ML IJ SUSP
80.0000 mg | Freq: Once | INTRAMUSCULAR | Status: AC
  Administered 2022-09-24: 80 mg

## 2022-09-24 NOTE — Progress Notes (Signed)
Functional Pain Scale - descriptive words and definitions   Severe (9)  Cannot do any ADL's even with assistance can barely talk/unable to sleep and unable to use distraction. Severe range order  Average Pain 9   +Driver, -BT, -Dye Allergies.  Lower back pain on left side that radiates all the way down the left leg

## 2022-09-24 NOTE — Progress Notes (Signed)
Brenda Schmidt - 89 y.o. female MRN [id number]  Date of birth: 1929/07/07  Office Visit Note: Visit Date: 09/24/2022 PCP: Moshe Cipro, MD Referred by: Moshe Cipro, MD  Subjective: Chief Complaint  Patient presents with   Lower Back - Pain   HPI:  Brenda Schmidt is a 89 y.o. female who comes in today at the request of Dr. Jean Rosenthal for planned Bilateral L4-5 Lumbar Transforaminal epidural steroid injection with fluoroscopic guidance.  The patient has failed conservative care including home exercise, medications, time and activity modification.  This injection will be diagnostic and hopefully therapeutic.  Please see requesting physician notes for further details and justification.  Last time I saw the patient was in 2019 and we did complete interlaminar injection at that time with decent relief of symptoms.  Do the fact that she is now on Xarelto as well as not having updated MRI we did elect to complete transforaminal injections which would be a safer injection if the level is very narrow at this point as well as being able to do that injection while she is on the anticoagulation.  Depending on relief patient will need updated MRI of the lumbar spine to see if there is any other areas to inject or from a different approach.  Also of note she is very anxious and would really benefit from preprocedure Valium.       ROS Otherwise per HPI.  Assessment & Plan: Visit Diagnoses:    ICD-10-CM   1. Lumbar radiculopathy  M54.16 XR C-ARM NO REPORT    Epidural Steroid injection    methylPREDNISolone acetate (DEPO-MEDROL) injection 80 mg      Plan: No additional findings.   Meds & Orders:  Meds ordered this encounter  Medications   methylPREDNISolone acetate (DEPO-MEDROL) injection 80 mg    Orders Placed This Encounter  Procedures   XR C-ARM NO REPORT   Epidural Steroid injection    Follow-up: Return for visit to requesting provider as needed.   Procedures: No  procedures performed  Lumbosacral Transforaminal Epidural Steroid Injection - Sub-Pedicular Approach with Fluoroscopic Guidance  Patient: Brenda Schmidt      Date of Birth: Jul 13, 1929 MRN: [id number] PCP: Moshe Cipro, MD      Visit Date: 09/24/2022   Universal Protocol:    Date/Time: 09/24/2022  Consent Given By: the patient  Position: PRONE  Additional Comments: Vital signs were monitored before and after the procedure. Patient was prepped and draped in the usual sterile fashion. The correct patient, procedure, and site was verified.   Injection Procedure Details:   Procedure diagnoses: Lumbar radiculopathy [M54.16]    Meds Administered:  Meds ordered this encounter  Medications   methylPREDNISolone acetate (DEPO-MEDROL) injection 80 mg    Laterality: Bilateral  Location/Site: L4  Needle:5.0 in., 22 ga.  Short bevel or Quincke spinal needle  Needle Placement: Transforaminal  Findings:    -Comments: Excellent flow of contrast along the nerve, nerve root and into the epidural space.  Patient did well but would benefit from preprocedure Valium Sharol Given to being uncomfortable on the table with position and anxiety.  Procedure Details: After squaring off the end-plates to get a true AP view, the C-arm was positioned so that an oblique view of the foramen as noted above was visualized. The target area is just inferior to the "nose of the scotty dog" or sub pedicular. The soft tissues overlying this structure were infiltrated with 2-3 ml. of 1% Lidocaine without Epinephrine.  The spinal needle was  inserted toward the target using a "trajectory" view along the fluoroscope beam.  Under AP and lateral visualization, the needle was advanced so it did not puncture dura and was located close the 6 O'Clock position of the pedical in AP tracterory. Biplanar projections were used to confirm position. Aspiration was confirmed to be negative for CSF and/or blood. A 1-2 ml. volume of  Isovue-250 was injected and flow of contrast was noted at each level. Radiographs were obtained for documentation purposes.   After attaining the desired flow of contrast documented above, a 0.5 to 1.0 ml test dose of 0.25% Marcaine was injected into each respective transforaminal space.  The patient was observed for 90 seconds post injection.  After no sensory deficits were reported, and normal lower extremity motor function was noted,   the above injectate was administered so that equal amounts of the injectate were placed at each foramen (level) into the transforaminal epidural space.   Additional Comments:  No complications occurred Dressing: 2 x 2 sterile gauze and Band-Aid    Post-procedure details: Patient was observed during the procedure. Post-procedure instructions were reviewed.  Patient left the clinic in stable condition.    Clinical History: 12/20/2017 Procedure: MRI LUMBAR SPINE W/O CONTRAST  page 1 of 4  [id number] MRI LUMBAR SPINE W/O CO LBP [id number]  Reason for study: LBP  Comparison: CT abdomen pelvis 11/03/09  Technique: Multiplanar, multisequence MR images of the lumbar spine  extending from T12 to the sacrum was obtained. No contrast was  administered.  Findings:  The visualized kidneys, liver and left adrenal gland are unremarkable.  A right adrenal 11 mm nodule is stable. No evidence for acute  compression fracture. There is fatty replacement and osteopenia of  the bony structures.  L1-L2: Severe disc desiccation. Minimal grade 1 retrolisthesis.  Moderate facet arthropathy and ligament flavum thickening. Mild  broad-based disc bulge with lateral disc protrusion.  L2-L3: Severe disc desiccation. Moderate facet arthropathy and  ligament flavum thickening. Moderate bilateral neuroforamina  narrowing without spinal stenosis. Minimal grade 1 retrolisthesis.  L3-L4: Minimal grade 1 retrolisthesis with severe disc desiccation.  Moderate facet arthropathy and ligament  flavum thickening. Mild  broad-based disc bulge with lateral disc protrusion. There is  moderate right with severe left neuroforamina narrowing as well as  moderate spinal stenosis.  L4-L5: Severe facet arthropathy and ligament flavum thickening. Mild  broad-based disc bulge with lateral disc protrusion. There is grade 1  anterolisthesis. There is moderate bilateral neuroforamina narrowing  with moderate-severe spinal stenosis.  L5-S1: Mild broad-based disc bulge with lateral disc protrusion.  Moderate left and severe right facet arthropathy with lateral disc  osteophyte complex. There is severe right with mild left  neuroforaminal narrowing.  IMPRESSION:  Multilevel degenerative changes with most significant findings, severe  left L3-L4 and right L5-S1 neuroforamina narrowing with moderate to  severe spinal stenosis at L4-L5. Additional findings as above.     Objective:  VS:  HT:    WT:   BMI:     BP:(!) 161/76  HR:70bpm  TEMP: ( )  RESP:  Physical Exam Vitals and nursing note reviewed.  Constitutional:      General: She is not in acute distress.    Appearance: Normal appearance. She is not ill-appearing.  HENT:     Head: Normocephalic and atraumatic.     Right Ear: External ear normal.     Left Ear: External ear normal.  Eyes:     Extraocular Movements: Extraocular movements intact.  Cardiovascular:     Rate and Rhythm: Normal rate.     Pulses: Normal pulses.  Pulmonary:     Effort: Pulmonary effort is normal. No respiratory distress.  Abdominal:     General: There is no distension.     Palpations: Abdomen is soft.  Musculoskeletal:        General: Tenderness present.     Cervical back: Neck supple.     Right lower leg: No edema.     Left lower leg: No edema.     Comments: Patient has good distal strength with no pain over the greater trochanters.  No clonus or focal weakness.  Skin:    Findings: No erythema, lesion or rash.  Neurological:     General: No focal  deficit present.     Mental Status: She is alert and oriented to person, place, and time.     Sensory: No sensory deficit.     Motor: No weakness or abnormal muscle tone.     Coordination: Coordination normal.  Psychiatric:        Mood and Affect: Mood normal.        Behavior: Behavior normal.      Imaging: No results found.

## 2022-09-24 NOTE — Patient Instructions (Signed)

## 2022-09-30 NOTE — Procedures (Signed)
Lumbosacral Transforaminal Epidural Steroid Injection - Sub-Pedicular Approach with Fluoroscopic Guidance  Patient: Brenda Schmidt      Date of Birth: 13-Apr-1929 MRN: [id number] PCP: Moshe Cipro, MD      Visit Date: 09/24/2022   Universal Protocol:    Date/Time: 09/24/2022  Consent Given By: the patient  Position: PRONE  Additional Comments: Vital signs were monitored before and after the procedure. Patient was prepped and draped in the usual sterile fashion. The correct patient, procedure, and site was verified.   Injection Procedure Details:   Procedure diagnoses: Lumbar radiculopathy [M54.16]    Meds Administered:  Meds ordered this encounter  Medications   methylPREDNISolone acetate (DEPO-MEDROL) injection 80 mg    Laterality: Bilateral  Location/Site: L4  Needle:5.0 in., 22 ga.  Short bevel or Quincke spinal needle  Needle Placement: Transforaminal  Findings:    -Comments: Excellent flow of contrast along the nerve, nerve root and into the epidural space.  Patient did well but would benefit from preprocedure Valium Sharol Given to being uncomfortable on the table with position and anxiety.  Procedure Details: After squaring off the end-plates to get a true AP view, the C-arm was positioned so that an oblique view of the foramen as noted above was visualized. The target area is just inferior to the "nose of the scotty dog" or sub pedicular. The soft tissues overlying this structure were infiltrated with 2-3 ml. of 1% Lidocaine without Epinephrine.  The spinal needle was inserted toward the target using a "trajectory" view along the fluoroscope beam.  Under AP and lateral visualization, the needle was advanced so it did not puncture dura and was located close the 6 O'Clock position of the pedical in AP tracterory. Biplanar projections were used to confirm position. Aspiration was confirmed to be negative for CSF and/or blood. A 1-2 ml. volume of Isovue-250 was injected and  flow of contrast was noted at each level. Radiographs were obtained for documentation purposes.   After attaining the desired flow of contrast documented above, a 0.5 to 1.0 ml test dose of 0.25% Marcaine was injected into each respective transforaminal space.  The patient was observed for 90 seconds post injection.  After no sensory deficits were reported, and normal lower extremity motor function was noted,   the above injectate was administered so that equal amounts of the injectate were placed at each foramen (level) into the transforaminal epidural space.   Additional Comments:  No complications occurred Dressing: 2 x 2 sterile gauze and Band-Aid    Post-procedure details: Patient was observed during the procedure. Post-procedure instructions were reviewed.  Patient left the clinic in stable condition.

## 2022-10-27 ENCOUNTER — Telehealth: Payer: Self-pay | Admitting: Physical Medicine and Rehabilitation

## 2022-10-27 NOTE — Telephone Encounter (Signed)
Pt gave permissin for her Care giver Wray Kearns to make an appt on her behalf. Pt needs an back injection form Dr Ernestina Patches. Please call Suan at [phone].

## 2022-10-28 ENCOUNTER — Ambulatory Visit: Payer: Medicare Other | Admitting: Orthopaedic Surgery

## 2022-10-29 NOTE — Telephone Encounter (Signed)
Spoke with caregiver to get what percentage and how long the injection lasted. She stated that the patient is currently admitted in the [facility] hospital due to a fall on 10/27/22. She also had a fall 10/16/22 and 10/25/22. Her POA is going to try to get her in a rehab facility and she will call back to inform us what the out come will be

## 2024-04-23 ENCOUNTER — Inpatient Hospital Stay (HOSPITAL_COMMUNITY)
Admission: RE | Admit: 2024-04-23 | Discharge: 2024-04-26 | DRG: 640 | Disposition: A | Discharge status: Skilled Nursing Facility | Source: Other Acute Inpatient Hospital | Attending: Internal Medicine | Admitting: Internal Medicine

## 2024-04-23 ENCOUNTER — Encounter (HOSPITAL_COMMUNITY): Payer: Self-pay

## 2024-04-23 DIAGNOSIS — N186 End stage renal disease: Secondary | ICD-10-CM | POA: Diagnosis not present

## 2024-04-23 DIAGNOSIS — Z9981 Dependence on supplemental oxygen: Secondary | ICD-10-CM | POA: Diagnosis not present

## 2024-04-23 DIAGNOSIS — I10 Essential (primary) hypertension: Secondary | ICD-10-CM | POA: Diagnosis not present

## 2024-04-23 DIAGNOSIS — Z66 Do not resuscitate: Secondary | ICD-10-CM | POA: Diagnosis present

## 2024-04-23 DIAGNOSIS — Z7951 Long term (current) use of inhaled steroids: Secondary | ICD-10-CM

## 2024-04-23 DIAGNOSIS — K219 Gastro-esophageal reflux disease without esophagitis: Secondary | ICD-10-CM | POA: Diagnosis present

## 2024-04-23 DIAGNOSIS — E785 Hyperlipidemia, unspecified: Secondary | ICD-10-CM | POA: Diagnosis present

## 2024-04-23 DIAGNOSIS — E875 Hyperkalemia: Secondary | ICD-10-CM | POA: Diagnosis present

## 2024-04-23 DIAGNOSIS — E871 Hypo-osmolality and hyponatremia: Secondary | ICD-10-CM | POA: Diagnosis present

## 2024-04-23 DIAGNOSIS — D638 Anemia in other chronic diseases classified elsewhere: Secondary | ICD-10-CM | POA: Diagnosis not present

## 2024-04-23 DIAGNOSIS — Z8673 Personal history of transient ischemic attack (TIA), and cerebral infarction without residual deficits: Secondary | ICD-10-CM | POA: Diagnosis not present

## 2024-04-23 DIAGNOSIS — Z96641 Presence of right artificial hip joint: Secondary | ICD-10-CM | POA: Diagnosis present

## 2024-04-23 DIAGNOSIS — Z515 Encounter for palliative care: Secondary | ICD-10-CM | POA: Diagnosis not present

## 2024-04-23 DIAGNOSIS — D509 Iron deficiency anemia, unspecified: Secondary | ICD-10-CM | POA: Diagnosis present

## 2024-04-23 DIAGNOSIS — Z7189 Other specified counseling: Secondary | ICD-10-CM | POA: Diagnosis not present

## 2024-04-23 DIAGNOSIS — Z79899 Other long term (current) drug therapy: Secondary | ICD-10-CM | POA: Diagnosis not present

## 2024-04-23 DIAGNOSIS — D72829 Elevated white blood cell count, unspecified: Secondary | ICD-10-CM | POA: Diagnosis present

## 2024-04-23 DIAGNOSIS — H9190 Unspecified hearing loss, unspecified ear: Secondary | ICD-10-CM | POA: Diagnosis present

## 2024-04-23 DIAGNOSIS — Z604 Social exclusion and rejection: Secondary | ICD-10-CM | POA: Diagnosis present

## 2024-04-23 DIAGNOSIS — J9621 Acute and chronic respiratory failure with hypoxia: Secondary | ICD-10-CM | POA: Diagnosis present

## 2024-04-23 DIAGNOSIS — R001 Bradycardia, unspecified: Secondary | ICD-10-CM | POA: Diagnosis present

## 2024-04-23 DIAGNOSIS — Z87442 Personal history of urinary calculi: Secondary | ICD-10-CM | POA: Diagnosis not present

## 2024-04-23 DIAGNOSIS — E8721 Acute metabolic acidosis: Secondary | ICD-10-CM | POA: Diagnosis present

## 2024-04-23 DIAGNOSIS — I132 Hypertensive heart and chronic kidney disease with heart failure and with stage 5 chronic kidney disease, or end stage renal disease: Secondary | ICD-10-CM | POA: Diagnosis present

## 2024-04-23 DIAGNOSIS — I5033 Acute on chronic diastolic (congestive) heart failure: Secondary | ICD-10-CM | POA: Diagnosis present

## 2024-04-23 DIAGNOSIS — Z86711 Personal history of pulmonary embolism: Secondary | ICD-10-CM | POA: Diagnosis not present

## 2024-04-23 DIAGNOSIS — D631 Anemia in chronic kidney disease: Secondary | ICD-10-CM | POA: Diagnosis present

## 2024-04-23 DIAGNOSIS — R339 Retention of urine, unspecified: Secondary | ICD-10-CM | POA: Diagnosis present

## 2024-04-23 DIAGNOSIS — Z7901 Long term (current) use of anticoagulants: Secondary | ICD-10-CM | POA: Diagnosis not present

## 2024-04-23 DIAGNOSIS — Z86718 Personal history of other venous thrombosis and embolism: Secondary | ICD-10-CM | POA: Diagnosis not present

## 2024-04-23 DIAGNOSIS — N185 Chronic kidney disease, stage 5: Secondary | ICD-10-CM | POA: Diagnosis present

## 2024-04-23 DIAGNOSIS — Z789 Other specified health status: Secondary | ICD-10-CM | POA: Diagnosis not present

## 2024-04-23 DIAGNOSIS — E66811 Obesity, class 1: Secondary | ICD-10-CM | POA: Diagnosis present

## 2024-04-23 DIAGNOSIS — Z711 Person with feared health complaint in whom no diagnosis is made: Secondary | ICD-10-CM | POA: Diagnosis not present

## 2024-04-23 DIAGNOSIS — Z6835 Body mass index (BMI) 35.0-35.9, adult: Secondary | ICD-10-CM

## 2024-04-23 DIAGNOSIS — R195 Other fecal abnormalities: Secondary | ICD-10-CM | POA: Diagnosis present

## 2024-04-23 LAB — URINALYSIS, ROUTINE W REFLEX MICROSCOPIC
Bilirubin Urine: NEGATIVE
Glucose, UA: NEGATIVE mg/dL
Ketones, ur: NEGATIVE mg/dL
Nitrite: NEGATIVE
Protein, ur: >=300 mg/dL — AB
Specific Gravity, Urine: 1.013 (ref 1.005–1.030)
pH: 8 (ref 5.0–8.0)

## 2024-04-23 LAB — RENAL FUNCTION PANEL
Albumin: 2.3 g/dL — ABNORMAL LOW (ref 3.5–5.0)
Anion gap: 15 (ref 5–15)
BUN: 105 mg/dL — ABNORMAL HIGH (ref 8–23)
CO2: 17 mmol/L — ABNORMAL LOW (ref 22–32)
Calcium: 10.2 mg/dL (ref 8.9–10.3)
Chloride: 92 mmol/L — ABNORMAL LOW (ref 98–111)
Creatinine, Ser: 6.72 mg/dL — ABNORMAL HIGH (ref 0.44–1.00)
GFR, Estimated: 5 mL/min — ABNORMAL LOW (ref >60)
Glucose, Bld: 102 mg/dL — ABNORMAL HIGH (ref 70–99)
Phosphorus: 6.4 mg/dL — ABNORMAL HIGH (ref 2.5–4.6)
Potassium: 6.7 mmol/L (ref 3.5–5.1)
Sodium: 124 mmol/L — ABNORMAL LOW (ref 135–145)

## 2024-04-23 LAB — BASIC METABOLIC PANEL WITH GFR
Anion gap: 12 (ref 5–15)
BUN: 108 mg/dL — ABNORMAL HIGH (ref 8–23)
CO2: 21 mmol/L — ABNORMAL LOW (ref 22–32)
Calcium: 9.9 mg/dL (ref 8.9–10.3)
Chloride: 92 mmol/L — ABNORMAL LOW (ref 98–111)
Creatinine, Ser: 7.05 mg/dL — ABNORMAL HIGH (ref 0.44–1.00)
GFR, Estimated: 5 mL/min — ABNORMAL LOW (ref >60)
Glucose, Bld: 108 mg/dL — ABNORMAL HIGH (ref 70–99)
Potassium: 6.8 mmol/L (ref 3.5–5.1)
Sodium: 125 mmol/L — ABNORMAL LOW (ref 135–145)

## 2024-04-23 LAB — BRAIN NATRIURETIC PEPTIDE: B Natriuretic Peptide: 1112.5 pg/mL — ABNORMAL HIGH (ref 0.0–100.0)

## 2024-04-23 LAB — CBC
HCT: 27.2 % — ABNORMAL LOW (ref 36.0–46.0)
Hemoglobin: 8.7 g/dL — ABNORMAL LOW (ref 12.0–15.0)
MCH: 26.3 pg (ref 26.0–34.0)
MCHC: 32 g/dL (ref 30.0–36.0)
MCV: 82.2 fL (ref 80.0–100.0)
Platelets: 309 K/uL (ref 150–400)
RBC: 3.31 MIL/uL — ABNORMAL LOW (ref 3.87–5.11)
RDW: 15.1 % (ref 11.5–15.5)
WBC: 13.4 K/uL — ABNORMAL HIGH (ref 4.0–10.5)
nRBC: 0 % (ref 0.0–0.2)

## 2024-04-23 LAB — PROTIME-INR
INR: 1.1 (ref 0.8–1.2)
Prothrombin Time: 15.1 s (ref 11.4–15.2)

## 2024-04-23 LAB — HEPARIN LEVEL (UNFRACTIONATED): Heparin Unfractionated: 0.3 [IU]/mL (ref 0.30–0.70)

## 2024-04-23 LAB — APTT: aPTT: 62 s — ABNORMAL HIGH (ref 24–36)

## 2024-04-23 MED ORDER — SUCRALFATE 1 G PO TABS
1.0000 g | ORAL_TABLET | Freq: Four times a day (QID) | ORAL | Status: DC
  Administered 2024-04-23 – 2024-04-26 (×7): 1 g via ORAL
  Filled 2024-04-23 (×15): qty 1

## 2024-04-23 MED ORDER — DULOXETINE HCL 60 MG PO CPEP
60.0000 mg | ORAL_CAPSULE | Freq: Every day | ORAL | Status: DC
  Administered 2024-04-23 – 2024-04-26 (×3): 60 mg via ORAL
  Filled 2024-04-23 (×3): qty 1

## 2024-04-23 MED ORDER — ACETAMINOPHEN 650 MG RE SUPP: 650.0000 mg | Freq: Four times a day (QID) | RECTAL | Status: DC | PRN

## 2024-04-23 MED ORDER — DEXTROSE 50 % IV SOLN
1.0000 | Freq: Once | INTRAVENOUS | Status: AC
  Administered 2024-04-24: 50 mL via INTRAVENOUS
  Filled 2024-04-23: qty 50

## 2024-04-23 MED ORDER — LIDOCAINE 5 % EX PTCH
2.0000 | MEDICATED_PATCH | CUTANEOUS | Status: DC
  Administered 2024-04-23 – 2024-04-26 (×2): 2 via TRANSDERMAL
  Filled 2024-04-23 (×2): qty 2

## 2024-04-23 MED ORDER — CALCIUM GLUCONATE-NACL 1-0.675 GM/50ML-% IV SOLN
1.0000 g | Freq: Once | INTRAVENOUS | Status: AC
  Administered 2024-04-24: 1000 mg via INTRAVENOUS
  Filled 2024-04-23: qty 50

## 2024-04-23 MED ORDER — ACETAMINOPHEN 325 MG PO TABS
650.0000 mg | ORAL_TABLET | Freq: Four times a day (QID) | ORAL | Status: DC | PRN
  Administered 2024-04-24 – 2024-04-25 (×2): 650 mg via ORAL
  Filled 2024-04-23 (×2): qty 2

## 2024-04-23 MED ORDER — HEPARIN BOLUS VIA INFUSION
4000.0000 [IU] | Freq: Once | INTRAVENOUS | Status: DC
  Filled 2024-04-23: qty 4000

## 2024-04-23 MED ORDER — ALBUTEROL SULFATE (2.5 MG/3ML) 0.083% IN NEBU
10.0000 mg | INHALATION_SOLUTION | Freq: Once | RESPIRATORY_TRACT | Status: AC
  Administered 2024-04-23: 10 mg via RESPIRATORY_TRACT
  Filled 2024-04-23: qty 12

## 2024-04-23 MED ORDER — FUROSEMIDE 10 MG/ML IJ SOLN
80.0000 mg | Freq: Once | INTRAMUSCULAR | Status: AC
  Administered 2024-04-23: 80 mg via INTRAVENOUS
  Filled 2024-04-23: qty 8

## 2024-04-23 MED ORDER — TRAZODONE HCL 50 MG PO TABS
50.0000 mg | ORAL_TABLET | Freq: Every day | ORAL | Status: DC
  Administered 2024-04-23 – 2024-04-25 (×2): 50 mg via ORAL
  Filled 2024-04-23 (×2): qty 1

## 2024-04-23 MED ORDER — SODIUM ZIRCONIUM CYCLOSILICATE 10 G PO PACK: 10.0000 g | PACK | Freq: Three times a day (TID) | ORAL | Status: DC

## 2024-04-23 MED ORDER — HYDRALAZINE HCL 20 MG/ML IJ SOLN: 10.0000 mg | INTRAMUSCULAR | Status: DC | PRN

## 2024-04-23 MED ORDER — SODIUM ZIRCONIUM CYCLOSILICATE 10 G PO PACK
10.0000 g | PACK | Freq: Two times a day (BID) | ORAL | Status: DC
  Administered 2024-04-23 (×2): 10 g via ORAL
  Filled 2024-04-23 (×2): qty 1

## 2024-04-23 MED ORDER — SODIUM BICARBONATE 8.4 % IV SOLN
50.0000 meq | Freq: Once | INTRAVENOUS | Status: AC
  Administered 2024-04-23: 50 meq via INTRAVENOUS
  Filled 2024-04-23: qty 50

## 2024-04-23 MED ORDER — FLUTICASONE FUROATE-VILANTEROL 200-25 MCG/ACT IN AEPB
1.0000 | INHALATION_SPRAY | Freq: Every day | RESPIRATORY_TRACT | Status: DC
  Administered 2024-04-25 – 2024-04-26 (×2): 1 via RESPIRATORY_TRACT
  Filled 2024-04-23: qty 28

## 2024-04-23 MED ORDER — ATORVASTATIN CALCIUM 10 MG PO TABS
20.0000 mg | ORAL_TABLET | Freq: Every day | ORAL | Status: DC
  Administered 2024-04-25 – 2024-04-26 (×2): 20 mg via ORAL
  Filled 2024-04-23 (×2): qty 2

## 2024-04-23 MED ORDER — SODIUM BICARBONATE 8.4 % IV SOLN
50.0000 meq | Freq: Once | INTRAVENOUS | Status: AC
  Administered 2024-04-24: 50 meq via INTRAVENOUS

## 2024-04-23 MED ORDER — SODIUM CHLORIDE 0.9% FLUSH
3.0000 mL | Freq: Two times a day (BID) | INTRAVENOUS | Status: DC
  Administered 2024-04-23 – 2024-04-26 (×6): 3 mL via INTRAVENOUS

## 2024-04-23 MED ORDER — AMLODIPINE BESYLATE 10 MG PO TABS
10.0000 mg | ORAL_TABLET | Freq: Every day | ORAL | Status: DC
  Administered 2024-04-25 – 2024-04-26 (×2): 10 mg via ORAL
  Filled 2024-04-23 (×2): qty 1

## 2024-04-23 MED ORDER — GABAPENTIN 300 MG PO CAPS
300.0000 mg | ORAL_CAPSULE | Freq: Every day | ORAL | Status: DC
  Administered 2024-04-23 – 2024-04-25 (×2): 300 mg via ORAL
  Filled 2024-04-23 (×2): qty 1

## 2024-04-23 MED ORDER — ALBUTEROL SULFATE (2.5 MG/3ML) 0.083% IN NEBU
2.5000 mg | INHALATION_SOLUTION | Freq: Four times a day (QID) | RESPIRATORY_TRACT | Status: DC | PRN
  Administered 2024-04-24 – 2024-04-25 (×2): 2.5 mg via RESPIRATORY_TRACT
  Filled 2024-04-23 (×2): qty 3

## 2024-04-23 MED ORDER — HEPARIN (PORCINE) 25000 UT/250ML-% IV SOLN
1200.0000 [IU]/h | INTRAVENOUS | Status: DC
  Administered 2024-04-23: 1150 [IU]/h via INTRAVENOUS
  Filled 2024-04-23: qty 250

## 2024-04-23 MED ORDER — INSULIN ASPART 100 UNIT/ML IJ SOLN
10.0000 [IU] | Freq: Once | INTRAMUSCULAR | Status: AC
  Administered 2024-04-24: 10 [IU] via INTRAVENOUS

## 2024-04-23 MED ORDER — CALCIUM GLUCONATE 10 % IV SOLN
1.0000 g | Freq: Once | INTRAVENOUS | Status: AC
  Administered 2024-04-23: 1 g via INTRAVENOUS
  Filled 2024-04-23: qty 10

## 2024-04-23 MED ORDER — GERHARDT'S BUTT CREAM
TOPICAL_CREAM | Freq: Every day | CUTANEOUS | Status: DC
  Filled 2024-04-23: qty 60

## 2024-04-23 MED ORDER — FUROSEMIDE 10 MG/ML IJ SOLN
100.0000 mg | Freq: Once | INTRAVENOUS | Status: AC
  Administered 2024-04-23: 100 mg via INTRAVENOUS
  Filled 2024-04-23 (×2): qty 10

## 2024-04-23 NOTE — Progress Notes (Signed)
 Overnight progress note  Patient continues to be hyperkalemic with potassium 6.8 on repeat labs.  Discussed with nephrology (Dr. Dolan) - he recommends continuing medical treatment for hyperkalemia and giving additional IV Lasix  80 mg x 1.  Insulin /dextrose , albuterol , sodium bicarb, and calcium  gluconate ordered.  Continue Lokelma  twice daily.  Stat EKG ordered.  Monitor potassium level every 4 hours.  Nephrology will see the patient in the morning and decide if she needs to be started on dialysis.

## 2024-04-23 NOTE — Progress Notes (Signed)
 Text page Dr. Orson Critical lab results Potassium 6.8 and to review all BMP results. Tim RN aware
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
## 2024-04-23 NOTE — Progress Notes (Signed)
 PHARMACY - ANTICOAGULATION CONSULT NOTE  Pharmacy Consult for heparin  Indication: h/o PE/DVT  No Known Allergies  Patient Measurements: Height: 5' 3 (160 cm) Weight: 91.2 kg (201 lb 1 oz) IBW/kg (Calculated) : 52.4 HEPARIN  DW (KG): 73.2  Vital Signs: Temp: 98.3 F (36.8 C) (08/31 2021) Temp Source: Oral (08/31 2021) BP: 163/54 (08/31 2021) Pulse Rate: 57 (08/31 2021)  Labs: Recent Labs    04/23/24 1219 04/23/24 2204  HGB 8.7*  --   HCT 27.2*  --   PLT 309  --   APTT  --  62*  LABPROT 15.1  --   INR 1.1  --   HEPARINUNFRC  --  0.30  CREATININE 6.72*  --     Estimated Creatinine Clearance: 5.6 mL/min (A) (by C-G formula based on SCr of 6.72 mg/dL (H)).   Assessment: Brenda Schmidt is a 76 yof presenting from from SNF with ShOB and hyperkalemia to [facility] and then transferred to [facility] for possible new HD.  Labs from [facility] 8/30: SCr 5.88, BUN 99, K 8.4, Hgb 8.9 Treated with Ca, D50, Insulin , and albuterol  in [facility] ED No anticoag documented as given in [facility] records  Noted pt with h/o DVT/PE and has been on Xarelto  in the past. However there is no AC on the [facility] from [facility] (where pt was prior to [facility] admission) although lifelong AC was recommended per her Care Everywhere records in 2023.   Baseline aPTT and heparin  level not drawn.  8h aPTT 62 seconds (slightly subtherapeutic) and heparin  level 0.3 (low end of therapeutic). Will utilize heparin  level from now on as no AC on MAR from [employer].  Goal of Therapy:  Heparin  level 0.3-0.7 units/ml Monitor platelets by anticoagulation protocol: Yes   Plan:  Increase heparin  infusion to 1200 units/hr F/u 8 hr heparin  level, no further aPTTs  Vito Ralph, PharmD, BCPS Please see amion for complete clinical pharmacist phone list 04/23/2024 10:40 PM

## 2024-04-23 NOTE — Progress Notes (Signed)
 Patient arrived at the unit by [facility],upon arrival pt is alert,CHG bath given,vitals taken,CCMD notified,placed in [place] by RT ,Admission notified,pt oriented to the unit,brother present at bedside
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
## 2024-04-23 NOTE — Progress Notes (Signed)
 Lab called with critical K+value of 6.7, MD paged and aware.

## 2024-04-24 ENCOUNTER — Other Ambulatory Visit: Payer: Self-pay

## 2024-04-24 ENCOUNTER — Encounter (HOSPITAL_COMMUNITY): Payer: Self-pay | Admitting: Internal Medicine

## 2024-04-24 ENCOUNTER — Inpatient Hospital Stay (HOSPITAL_COMMUNITY)

## 2024-04-24 DIAGNOSIS — D72829 Elevated white blood cell count, unspecified: Secondary | ICD-10-CM | POA: Diagnosis not present

## 2024-04-24 DIAGNOSIS — E875 Hyperkalemia: Secondary | ICD-10-CM | POA: Diagnosis not present

## 2024-04-24 DIAGNOSIS — J9621 Acute and chronic respiratory failure with hypoxia: Secondary | ICD-10-CM | POA: Diagnosis not present

## 2024-04-24 DIAGNOSIS — N186 End stage renal disease: Secondary | ICD-10-CM | POA: Diagnosis not present

## 2024-04-24 LAB — CBC
HCT: 23.4 % — ABNORMAL LOW (ref 36.0–46.0)
HCT: 24.6 % — ABNORMAL LOW (ref 36.0–46.0)
Hemoglobin: 7.5 g/dL — ABNORMAL LOW (ref 12.0–15.0)
Hemoglobin: 8 g/dL — ABNORMAL LOW (ref 12.0–15.0)
MCH: 26 pg (ref 26.0–34.0)
MCH: 26.6 pg (ref 26.0–34.0)
MCHC: 32.1 g/dL (ref 30.0–36.0)
MCHC: 32.5 g/dL (ref 30.0–36.0)
MCV: 81.3 fL (ref 80.0–100.0)
MCV: 81.7 fL (ref 80.0–100.0)
Platelets: 300 K/uL (ref 150–400)
Platelets: 306 K/uL (ref 150–400)
RBC: 2.88 MIL/uL — ABNORMAL LOW (ref 3.87–5.11)
RBC: 3.01 MIL/uL — ABNORMAL LOW (ref 3.87–5.11)
RDW: 15.2 % (ref 11.5–15.5)
RDW: 15.3 % (ref 11.5–15.5)
WBC: 9.5 K/uL (ref 4.0–10.5)
WBC: 9.8 K/uL (ref 4.0–10.5)
nRBC: 0 % (ref 0.0–0.2)
nRBC: 0 % (ref 0.0–0.2)

## 2024-04-24 LAB — BASIC METABOLIC PANEL WITH GFR
Anion gap: 15 (ref 5–15)
Anion gap: 15 (ref 5–15)
Anion gap: 19 — ABNORMAL HIGH (ref 5–15)
BUN: 108 mg/dL — ABNORMAL HIGH (ref 8–23)
BUN: 108 mg/dL — ABNORMAL HIGH (ref 8–23)
BUN: 109 mg/dL — ABNORMAL HIGH (ref 8–23)
CO2: 19 mmol/L — ABNORMAL LOW (ref 22–32)
CO2: 21 mmol/L — ABNORMAL LOW (ref 22–32)
CO2: 17 mmol/L — ABNORMAL LOW (ref 22–32)
Calcium: 9.1 mg/dL (ref 8.9–10.3)
Calcium: 9.6 mg/dL (ref 8.9–10.3)
Calcium: 9.7 mg/dL (ref 8.9–10.3)
Chloride: 92 mmol/L — ABNORMAL LOW (ref 98–111)
Chloride: 92 mmol/L — ABNORMAL LOW (ref 98–111)
Chloride: 94 mmol/L — ABNORMAL LOW (ref 98–111)
Creatinine, Ser: 6.59 mg/dL — ABNORMAL HIGH (ref 0.44–1.00)
Creatinine, Ser: 7.13 mg/dL — ABNORMAL HIGH (ref 0.44–1.00)
Creatinine, Ser: 7.12 mg/dL — ABNORMAL HIGH (ref 0.44–1.00)
GFR, Estimated: 5 mL/min — ABNORMAL LOW (ref >60)
GFR, Estimated: 5 mL/min — ABNORMAL LOW (ref >60)
GFR, Estimated: 5 mL/min — ABNORMAL LOW (ref >60)
Glucose, Bld: 177 mg/dL — ABNORMAL HIGH (ref 70–99)
Glucose, Bld: 89 mg/dL (ref 70–99)
Glucose, Bld: 92 mg/dL (ref 70–99)
Potassium: 6.5 mmol/L (ref 3.5–5.1)
Potassium: 5.7 mmol/L — ABNORMAL HIGH (ref 3.5–5.1)
Potassium: 6.2 mmol/L — ABNORMAL HIGH (ref 3.5–5.1)
Sodium: 126 mmol/L — ABNORMAL LOW (ref 135–145)
Sodium: 128 mmol/L — ABNORMAL LOW (ref 135–145)
Sodium: 130 mmol/L — ABNORMAL LOW (ref 135–145)

## 2024-04-24 LAB — POTASSIUM: Potassium: 6.2 mmol/L — ABNORMAL HIGH (ref 3.5–5.1)

## 2024-04-24 MED ORDER — FUROSEMIDE 10 MG/ML IJ SOLN
80.0000 mg | Freq: Once | INTRAMUSCULAR | Status: AC
  Administered 2024-04-24: 80 mg via INTRAVENOUS
  Filled 2024-04-24: qty 8

## 2024-04-24 MED ORDER — PANTOPRAZOLE SODIUM 40 MG IV SOLR
40.0000 mg | Freq: Two times a day (BID) | INTRAVENOUS | Status: DC
  Administered 2024-04-24 – 2024-04-26 (×5): 40 mg via INTRAVENOUS
  Filled 2024-04-24 (×5): qty 10

## 2024-04-24 MED ORDER — SODIUM ZIRCONIUM CYCLOSILICATE 10 G PO PACK: 10.0000 g | PACK | Freq: Three times a day (TID) | ORAL | Status: AC

## 2024-04-24 MED ORDER — FUROSEMIDE 10 MG/ML IJ SOLN
100.0000 mg | Freq: Once | INTRAVENOUS | Status: AC
  Administered 2024-04-24: 100 mg via INTRAVENOUS
  Filled 2024-04-24: qty 10

## 2024-04-24 MED ORDER — SODIUM ZIRCONIUM CYCLOSILICATE 10 G PO PACK: 10.0000 g | PACK | Freq: Two times a day (BID) | ORAL | Status: DC

## 2024-04-24 MED ORDER — FUROSEMIDE 10 MG/ML IJ SOLN
100.0000 mg | Freq: Three times a day (TID) | INTRAVENOUS | Status: DC
  Filled 2024-04-24 (×3): qty 10

## 2024-04-24 NOTE — Progress Notes (Signed)
 Text Dr. Alfornia patient had a small black stool earlier this a.m. Hbg 7.5

## 2024-04-24 NOTE — Progress Notes (Signed)
 [facility] PROGRESS NOTE    Progress Note  Brenda Schmidt  FMW:[id number] DOB: [birth year] DOA: 04/23/2024 PCP: Milana Sharper, MD     Brief Narrative:   Brenda Schmidt is an 89 y.o. female past medical history of essential hypertension PE and DVT on Xarelto , chronic kidney disease stage V not on dialysis, recently seen in the ED 3 days prior to admission for hyperkalemia and at that time she refused dialysis, she returned to [facility] health ED the day prior to admission for shortness of breath was found to be in pulmonary edema required BiPAP and she was also found hyperkalemic, treated conservatively the patient and her her healthcare power of attorney agreed to dialysis so she was transferred to [facility].  Assessment/Plan:   Chronic kidney disease stage V with pulmonary edema/metabolic acidosis and hyperkalemia: On admission she was found to have a potassium of 8.4 which was treated medically with Lokelma , calcium  gluconate, bicarb, insulin  and glucose, came down to 6.7, creatinine of 6.2.   Continue IV Lasix  at high dose. Nephrology was consulted who agreed with management he also recommended to continue Lokelma  and IV Lasix  for pulmonary edema. If it becomes refractory then nephrology will proceed with dialysis. They have spoken to the family.  Acute on chronic respiratory failure with hypoxia: Likely due to pulmonary edema. She started on IV Lasix , urine output has been poorly recorded. We were able to wean her down to to her baseline of 2 L of oxygen. Continue current IV Lasix  dose.  Anemia of chronic disease/questionable melanotic stools: Hemoglobin documented in chart in 2022 was 12. On admission 8.7 she is on Xarelto  at home because of her history of DVT. Repeated this morning was 7.5 and there was documentation of melanotic stools. Anticoagulation was held. Risk and benefits discussed with the patient. Try to keep hemoglobin greater than  7.  Leukocytosis: Question reactive she has remained afebrile repeated this morning her white count is 9.5. Tmax is 98.6.  Sinus bradycardia: Coreg  was held on admission, she was continue on hydralazine  and Norvasc . Likely cause of her bradycardia was her acidosis.  Continue to hold Coreg  for now.  Essential hypertension: Continue hydralazine  and amlodipine . Continue to hold the Coreg  for now as her blood pressure is in the 110's.  History of PEs and DVTs: Xarelto  was held on admission she was started on a heparin  drip. This was held as according to the staff she has been having small black tarry stools. On admission her hemoglobin was 8.5, repeated this morning 7.5. FOBT is pending.  Hyperlipidemia: Continue statins.  GERD: Continue Carafate .  DVT prophylaxis: scd Family Communication:brother in law Status is: Inpatient Remains inpatient appropriate because: Hyperkalemia, pulmonary edema metabolic acidosis    Code Status:     Code Status Orders  (From admission, onward)           Start     Ordered   04/23/24 1116  Do not attempt resuscitation (DNR) Pre-Arrest Interventions Desired  (Code Status)  Continuous       Question Answer Comment  If pulseless and not breathing No CPR or chest compressions.   In Pre-Arrest Conditions (Patient Has Pulse and Is Breathing) May intubate, use advanced airway interventions and cardioversion/ACLS medications if appropriate or indicated. May transfer to ICU.   Consent: Discussion documented in EHR or advanced directives reviewed      04/23/24 1115           Code Status History     Date Active  Date Inactive Code Status Order ID Comments User Context   04/23/2024 1056 04/23/2024 1115 Full Code [id number]  Claudene Maximino LABOR, MD Inpatient   09/04/2018 0749 09/08/2018 1026 Full Code [id number]  Raenelle Coria, MD Inpatient   11/25/2012 1414 11/29/2012 1641 Full Code [id number]  Vernetta Lonni GRADE, MD Inpatient         IV  Access:   Peripheral IV   Procedures and diagnostic studies:   No results found.   Medical Consultants:   None.   Subjective:    Brenda Schmidt has no complaints today.  Objective:    Vitals:   04/23/24 2324 04/23/24 2342 04/24/24 0300 04/24/24 0332  BP: (!) 138/43   (!) 154/65  Pulse: (!) 54   73  Resp: 16   18  Temp: 98.2 F (36.8 C)   98.3 F (36.8 C)  TempSrc: Oral   Oral  SpO2: 98% 97%  100%  Weight:   89 kg   Height:       SpO2: 100 % O2 Flow Rate (L/min): 2 L/min FiO2 (%): (!) 2 %   Intake/Output Summary (Last 24 hours) at 04/24/2024 0619 Last data filed at 04/24/2024 0551 Gross per 24 hour  Intake 485.04 ml  Output 250 ml  Net 235.04 ml   Filed Weights   04/23/24 1200 04/24/24 0300  Weight: 91.2 kg 89 kg    Exam: General exam: In no acute distress. Respiratory system: Good air movement and clear to auscultation. Cardiovascular system: S1 & S2 heard, RRR. No JVD. Gastrointestinal system: Abdomen is nondistended, soft and nontender.  Extremities: No pedal edema. Skin: No rashes, lesions or ulcers Psychiatry: Judgement and insight appear normal. Mood & affect appropriate.    Data Reviewed:    Labs: Basic Metabolic Panel: Recent Labs  Lab 04/23/24 1219 04/23/24 2204 04/24/24 0027 04/24/24 0453  NA 124* 125* 126* 128*  K 6.7* 6.8* 6.5* 5.7*  CL 92* 92* 92* 92*  CO2 17* 21* 19* 21*  GLUCOSE 102* 108* 177* 89  BUN 105* 108* 108* 108*  CREATININE 6.72* 7.05* 6.59* 7.13*  CALCIUM  10.2 9.9 9.1 9.6  PHOS 6.4*  --   --   --    GFR Estimated Creatinine Clearance: 5.2 mL/min (A) (by C-G formula based on SCr of 7.13 mg/dL (H)). Liver Function Tests: Recent Labs  Lab 04/23/24 1219  ALBUMIN 2.3*   No results for input(s): LIPASE, AMYLASE in the last 168 hours. No results for input(s): AMMONIA in the last 168 hours. Coagulation profile Recent Labs  Lab 04/23/24 1219  INR 1.1   COVID-19 Labs  No results for input(s):  DDIMER, FERRITIN, LDH, CRP in the last 72 hours.  No results found for: SARSCOV2NAA  CBC: Recent Labs  Lab 04/23/24 1219 04/24/24 0453  WBC 13.4* 9.5  HGB 8.7* 7.5*  HCT 27.2* 23.4*  MCV 82.2 81.3  PLT 309 300   Cardiac Enzymes: No results for input(s): CKTOTAL, CKMB, CKMBINDEX, TROPONINI in the last 168 hours. BNP (last 3 results) No results for input(s): PROBNP in the last 8760 hours. CBG: No results for input(s): GLUCAP in the last 168 hours. D-Dimer: No results for input(s): DDIMER in the last 72 hours. Hgb A1c: No results for input(s): HGBA1C in the last 72 hours. Lipid Profile: No results for input(s): CHOL, HDL, LDLCALC, TRIG, CHOLHDL, LDLDIRECT in the last 72 hours. Thyroid function studies: No results for input(s): TSH, T4TOTAL, T3FREE, THYROIDAB in the last 72 hours.  Invalid input(s): FREET3 Anemia  work up: No results for input(s): VITAMINB12, FOLATE, FERRITIN, TIBC, IRON , RETICCTPCT in the last 72 hours. Sepsis Labs: Recent Labs  Lab 04/23/24 1219 04/24/24 0453  WBC 13.4* 9.5   Microbiology No results found for this or any previous visit (from the past 240 hours).   Medications:    amLODipine   10 mg Oral QAC breakfast   atorvastatin   20 mg Oral QAC breakfast   DULoxetine   60 mg Oral Daily   fluticasone  furoate-vilanterol  1 puff Inhalation Daily   gabapentin   300 mg Oral QHS   Gerhardt's butt cream   Topical Daily   lidocaine   2 patch Transdermal Q24H   sodium chloride  flush  3 mL Intravenous Q12H   sodium zirconium cyclosilicate   10 g Oral BID   sucralfate   1 g Oral QID   traZODone   50 mg Oral QHS   Continuous Infusions:  heparin  1,200 Units/hr (04/23/24 2306)      LOS: 1 day   Erle Odell Castor  [facility]  04/24/2024, 6:19 AM

## 2024-04-24 NOTE — Progress Notes (Addendum)
 [facility] Progress Note    Assessment/ Plan:   CKD5 -followed by [facility] in [place]. Has a history of what sounds like nephrolithiasis, will check renal u/s -hold ARB-thiazide moving forward -no indications for renal replacement therapy yet especially given her hyperkalemia is improving. Overall, would not recommend dialysis in this 89 year old patient. Would be concerned for further diminishing quality of life. Brother/POA Same is in agreement but is deferring to her and will discuss with her further today when he comes in -Avoid nephrotoxic medications including NSAIDs and iodinated intravenous contrast exposure unless the latter is absolutely indicated.  Preferred narcotic agents for pain control are hydromorphone , fentanyl , and methadone. Morphine  should not be used. Avoid Baclofen and avoid oral sodium phosphate  and magnesium  citrate based laxatives / bowel preps. Continue strict Input and Output monitoring. Will monitor the patient closely with you and intervene or adjust therapy as indicated by changes in clinical status/labs   Hyperkalemia -improving-down to 5.7 this AM, c/w lokelma  10g TID -if trending back up despite med mgmt then will need to revisit HD -lasix  as below  Acute on chronic hypoxic respiratory failure Acute on chronic diastolic CHF exacerbation -secondary to pulm edema, agree with lasix  100mg  x 1 dose today -last echo in 2020, consider repeat if needed -monitor strict I/O and daily weights  Hypervolemic hyponatremia -improving, up to 128 with diuresis  Anemia Melanotic stool -checking Fe panel, transfuse PRN for Hgb <7 -FOBT pending -on PPI  Acidosis -improving, watch for now, if downtrending then can consider adding on nahco3 supplementation  HTN -reasonably controlled given her age  H/o PE/DVT -heparin  gtt, per primary  Discussed with [name] (brother/POA) over the phone just now.  Addendum: afternoon labs reviewed, up  to 6.2. Ordered repeat lasix -will dose at 80mg  IV x 1.  Subjective:   Patient seen and examined bedside. No acute events overnight. Patient reports that her breathing is slightly better. Denies any chest pain, loss of appetite, dysgeusia, N/V. She wants to leave the decision in regards to dialysis Brother reports that she needs a stent removal for a kidney stone, was scheduled to have this done. ultrasound   Objective:   BP (!) 141/114 (BP Location: Right Arm)   Pulse 71   Temp 98 F (36.7 C) (Oral)   Resp (!) 23   Ht 5' 3 (1.6 m)   Wt 89 kg   SpO2 100%   BMI 34.76 kg/m   Intake/Output Summary (Last 24 hours) at 04/24/2024 9077 Last data filed at 04/24/2024 0551 Gross per 24 hour  Intake 485.04 ml  Output 250 ml  Net 235.04 ml   Weight change:   Physical Exam: Gen: NAD CVS:RRR Resp: decreased breath sounds bibasilar with fine rales Abd: soft, nt/nd Ext: no significant edema b/l Les Neuro: awake, alert, no myoclonic jerking observed  Imaging: No results found.  Labs: BMET Recent Labs  Lab 04/23/24 1219 04/23/24 2204 04/24/24 0027 04/24/24 0453  NA 124* 125* 126* 128*  K 6.7* 6.8* 6.5* 5.7*  CL 92* 92* 92* 92*  CO2 17* 21* 19* 21*  GLUCOSE 102* 108* 177* 89  BUN 105* 108* 108* 108*  CREATININE 6.72* 7.05* 6.59* 7.13*  CALCIUM  10.2 9.9 9.1 9.6  PHOS 6.4*  --   --   --    CBC Recent Labs  Lab 04/23/24 1219 04/24/24 0453  WBC 13.4* 9.5  HGB 8.7* 7.5*  HCT 27.2* 23.4*  MCV 82.2 81.3  PLT 309 300  Medications:     amLODipine   10 mg Oral QAC breakfast   atorvastatin   20 mg Oral QAC breakfast   DULoxetine   60 mg Oral Daily   fluticasone  furoate-vilanterol  1 puff Inhalation Daily   gabapentin   300 mg Oral QHS   Gerhardt's butt cream   Topical Daily   lidocaine   2 patch Transdermal Q24H   pantoprazole  (PROTONIX ) IV  40 mg Intravenous Q12H   sodium chloride  flush  3 mL Intravenous Q12H   sodium zirconium cyclosilicate   10 g Oral TID   sucralfate   1  g Oral QID   traZODone   50 mg Oral QHS      Ephriam Stank, MD [facility] 04/24/2024, 9:22 AM

## 2024-04-24 NOTE — Progress Notes (Signed)
 Informed by RN that patient had a small black stool earlier this morning and labs drawn afterwards showing hemoglobin 7.5.  She is hemodynamically stable.  No abdominal pain or hematemesis.  Per review of chart, Xarelto  was held on admission yesterday and patient was placed on heparin  drip due to history of PE in 2019 and 2020.  Hemoglobin was 8.7 yesterday on admission.  Spoke to pharmacist and heparin  drip has been held at this time.  FOBT ordered.  Do not see any previous EGD results in the chart.  Started IV Protonix  40 mg every 12 hours and will keep NPO.  GI needs to be consulted in the morning if FOBT positive.

## 2024-04-24 NOTE — Progress Notes (Signed)
 Did admission with Brother  unable to do suicide screening patient altered

## 2024-04-24 NOTE — Progress Notes (Signed)
 Brief Pharmacy Note  Informed that pt has had black stool and drop in Hgb.  Heparin  has been held while awaiting FOB results.  Will f/u when to resume heparin  infusion.  Marvetta Dauphin, PharmD, BCPS 04/24/2024 6:30 AM

## 2024-04-25 DIAGNOSIS — Z7189 Other specified counseling: Secondary | ICD-10-CM

## 2024-04-25 DIAGNOSIS — Z515 Encounter for palliative care: Secondary | ICD-10-CM

## 2024-04-25 DIAGNOSIS — N186 End stage renal disease: Secondary | ICD-10-CM | POA: Diagnosis not present

## 2024-04-25 DIAGNOSIS — J9621 Acute and chronic respiratory failure with hypoxia: Secondary | ICD-10-CM | POA: Diagnosis not present

## 2024-04-25 DIAGNOSIS — Z711 Person with feared health complaint in whom no diagnosis is made: Secondary | ICD-10-CM

## 2024-04-25 DIAGNOSIS — Z789 Other specified health status: Secondary | ICD-10-CM

## 2024-04-25 DIAGNOSIS — Z66 Do not resuscitate: Secondary | ICD-10-CM

## 2024-04-25 DIAGNOSIS — E875 Hyperkalemia: Secondary | ICD-10-CM | POA: Diagnosis not present

## 2024-04-25 DIAGNOSIS — D72829 Elevated white blood cell count, unspecified: Secondary | ICD-10-CM | POA: Diagnosis not present

## 2024-04-25 LAB — CBC
HCT: 26 % — ABNORMAL LOW (ref 36.0–46.0)
Hemoglobin: 8.4 g/dL — ABNORMAL LOW (ref 12.0–15.0)
MCH: 26.5 pg (ref 26.0–34.0)
MCHC: 32.3 g/dL (ref 30.0–36.0)
MCV: 82 fL (ref 80.0–100.0)
Platelets: 335 K/uL (ref 150–400)
RBC: 3.17 MIL/uL — ABNORMAL LOW (ref 3.87–5.11)
RDW: 15.3 % (ref 11.5–15.5)
WBC: 10.9 K/uL — ABNORMAL HIGH (ref 4.0–10.5)
nRBC: 0 % (ref 0.0–0.2)

## 2024-04-25 LAB — RENAL FUNCTION PANEL
Albumin: 2.2 g/dL — ABNORMAL LOW (ref 3.5–5.0)
Anion gap: 16 — ABNORMAL HIGH (ref 5–15)
BUN: 119 mg/dL — ABNORMAL HIGH (ref 8–23)
CO2: 18 mmol/L — ABNORMAL LOW (ref 22–32)
Calcium: 9.7 mg/dL (ref 8.9–10.3)
Chloride: 94 mmol/L — ABNORMAL LOW (ref 98–111)
Creatinine, Ser: 7.61 mg/dL — ABNORMAL HIGH (ref 0.44–1.00)
GFR, Estimated: 5 mL/min — ABNORMAL LOW (ref >60)
Glucose, Bld: 80 mg/dL (ref 70–99)
Phosphorus: 8 mg/dL — ABNORMAL HIGH (ref 2.5–4.6)
Potassium: 6.2 mmol/L — ABNORMAL HIGH (ref 3.5–5.1)
Sodium: 128 mmol/L — ABNORMAL LOW (ref 135–145)

## 2024-04-25 LAB — IRON AND TIBC
Iron: 34 ug/dL (ref 28–170)
Saturation Ratios: 15 % (ref 10.4–31.8)
TIBC: 224 ug/dL — ABNORMAL LOW (ref 250–450)
UIBC: 190 ug/dL

## 2024-04-25 LAB — OCCULT BLOOD X 1 CARD TO LAB, STOOL: Fecal Occult Bld: POSITIVE — AB

## 2024-04-25 LAB — FERRITIN: Ferritin: 228 ng/mL (ref 11–307)

## 2024-04-25 MED ORDER — SODIUM ZIRCONIUM CYCLOSILICATE 10 G PO PACK
10.0000 g | PACK | Freq: Three times a day (TID) | ORAL | Status: DC
  Administered 2024-04-25 – 2024-04-26 (×3): 10 g via ORAL
  Filled 2024-04-25 (×3): qty 1

## 2024-04-25 MED ORDER — CARVEDILOL 3.125 MG PO TABS
3.1250 mg | ORAL_TABLET | Freq: Every day | ORAL | Status: DC
  Administered 2024-04-25 – 2024-04-26 (×2): 3.125 mg via ORAL
  Filled 2024-04-25 (×2): qty 1

## 2024-04-25 MED ORDER — FUROSEMIDE 10 MG/ML IJ SOLN
100.0000 mg | Freq: Once | INTRAVENOUS | Status: AC
  Administered 2024-04-25: 100 mg via INTRAVENOUS
  Filled 2024-04-25: qty 10

## 2024-04-25 MED ORDER — MORPHINE SULFATE (PF) 2 MG/ML IV SOLN: 2.0000 mg | INTRAVENOUS | Status: DC | PRN

## 2024-04-25 MED ORDER — SODIUM CHLORIDE 0.9 % IV SOLN
200.0000 mg | Freq: Once | INTRAVENOUS | Status: AC
  Administered 2024-04-25: 200 mg via INTRAVENOUS
  Filled 2024-04-25: qty 10

## 2024-04-25 NOTE — Progress Notes (Signed)
 Pt placed back on Bipap while sleeping to maintain oxygen states above 87% while she was sleeping. RT called and will assess proper settings. Currently on Bipap with sats between 92-97%.

## 2024-04-25 NOTE — Plan of Care (Signed)
  Problem: Education: Goal: Knowledge of General Education information will improve Description: Including pain rating scale, medication(s)/side effects and non-pharmacologic comfort measures Outcome: Not Progressing   Problem: Clinical Measurements: Goal: Diagnostic test results will improve Outcome: Not Progressing Goal: Respiratory complications will improve Outcome: Not Progressing   Problem: Nutrition: Goal: Adequate nutrition will be maintained Outcome: Not Progressing   Problem: Elimination: Goal: Will not experience complications related to bowel motility Outcome: Not Progressing   Problem: Skin Integrity: Goal: Risk for impaired skin integrity will decrease Outcome: Not Progressing

## 2024-04-25 NOTE — Progress Notes (Signed)
 The fecal occult blood lab is positive.

## 2024-04-25 NOTE — Progress Notes (Signed)
 This chaplain responded to Dr. Dennise consult for EOL spiritual care. In preparation for the visit, the chaplain reviewed the chart notes and received an update from PMT NP-Amber.   The chaplain understands from the NP, the Pt. expressed no spiritual care needs at the time of PMT visit. The chaplain understands the Pt. is supported by a faith community outside the hospital.  This chaplain is available for F/U spiritual care as needed.  Chaplain Leeroy Hummer [phone]

## 2024-04-25 NOTE — Progress Notes (Signed)
 Pt had small dark stool. FOBT sent to lab.

## 2024-04-25 NOTE — Progress Notes (Signed)
 Overnight floor coverage  Informed by RN that patient did not void all night and bladder scan showed 470 mL.  In-N-Out cath ordered and bladder scans every 6 hours.    Later informed by RN that In-N-Out cath was done with 625 mL urine removed.  RN concerned that patient may have a UTI as urine is cloudy and foul-smelling.  UA ordered.

## 2024-04-25 NOTE — Progress Notes (Signed)
 Pt did not void all night.  Did a bladder scan which showed 470 mL in the bladder.  Informed the on call physician, who ordered a one time in and out cath and bladder scans every 6 hours.  Will continue to monitor.  Kristene Sotero BRAVO, RN
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
## 2024-04-25 NOTE — Progress Notes (Addendum)
 [facility] Progress Note    Assessment/ Plan:   CKD5 -followed by [facility] in [place]. Has a history of what sounds like nephrolithiasis, renal u/s without any acute findings -hold ARB-thiazide moving forward -no indications for renal replacement therapy yet however labs are pending this AM (discussed with RN). Overall, would not recommend dialysis in this 89 year old patient. Would be concerned for further diminishing quality of life. Recommend palliative care to address GOC -Avoid nephrotoxic medications including NSAIDs and iodinated intravenous contrast exposure unless the latter is absolutely indicated.  Preferred narcotic agents for pain control are hydromorphone , fentanyl , and methadone. Morphine  should not be used. Avoid Baclofen and avoid oral sodium phosphate  and magnesium  citrate based laxatives / bowel preps. Continue strict Input and Output monitoring. Will monitor the patient closely with you and intervene or adjust therapy as indicated by changes in clinical status/labs   Hyperkalemia -labs pending, c/w lokelma  10g TID for now -lasix  as below -hopefully addressing her urinary retention will help  Acute on chronic hypoxic respiratory failure Acute on chronic diastolic CHF exacerbation -last echo in 2020, consider repeat if needed -monitor strict I/O and daily weights -redose lasix  100mg  IV x 1 today  Hypervolemic hyponatremia -improving, up to 128 with diuresis  Urinary retention -bladder scan q6h, I/O, consider foley if continuously retaining  Anemia Melanotic stool -transfuse PRN for Hgb <7 -FOBT pending, hep gtt on hold -on PPI -fe deficient, will order a dose of venofer  200mg  x 1 today  Acidosis -watch for now, if downtrending then can consider adding on nahco3 supplementation  HTN -reasonably controlled given her age -lasix  as above  H/o PE/DVT -off heparin  gtt given c/f GIB, per primary  Discussed with primary  service.  Addendum: AM labs reviewed. Remains hyperkalemic, renal function hasn't budged.10:50am phone call with brother [name] to provide updates---him and his other brother are in agreement about not pursuing dialysis. He has started this conversation with the patient yesterday and will advance conversations today when he comes and sees her. He is inquiring about comfort care and where she will be (she has been residing at [facility] in [place]. Palliative care consult pending. He is also requesting a chaplain-consult placed. Will certainly respect their wishes but will continue to manage medically until dispo is more clear. Discussed with primary service.  Subjective:   Patient seen and examined bedside. Has had urinary retention requiring I/O cath. She reports chest pain this AM, SOB unchanged. Discussed with primary svc. Labs not drawn yet. Otherwise denies any N/V, dysgeusia, loss of appetite. Seems a little disoriented for me this AM.   Objective:   BP (!) 163/73 (BP Location: Right Arm)   Pulse 87   Temp 97.6 F (36.4 C) (Axillary)   Resp 16   Ht 5' 3 (1.6 m)   Wt 91.4 kg   SpO2 96%   BMI 35.69 kg/m   Intake/Output Summary (Last 24 hours) at 04/25/2024 0747 Last data filed at 04/25/2024 9372 Gross per 24 hour  Intake 0 ml  Output 1175 ml  Net -1175 ml   Weight change: 0.2 kg  Physical Exam: Gen: NAD CVS:RRR Resp: decreased breath sounds bibasilar with fine rales Abd: soft, nt/nd Ext: no significant edema b/l Les Neuro: awake, alert, no myoclonic jerking observed, very hard of hearing  Imaging: US  RENAL Result Date: 04/24/2024 CLINICAL DATA:  Acute kidney injury EXAM: RENAL / URINARY TRACT ULTRASOUND COMPLETE COMPARISON:  Renal ultrasound 09/06/2014 FINDINGS: Right Kidney: Renal measurements: 10.5 x  6.1 x 5.7 cm = volume: 190 mL. Echogenicity is increased. No mass or hydronephrosis visualized. Left Kidney: Renal measurements: 9.5 x 4.1 x 4.9 cm = volume: 100 mL.  Echogenicity is increased. There is a 9 mm calculus in the left kidney. No mass or hydronephrosis visualized. Bladder: Appears normal for degree of bladder distention. Other: Incidental 9 mm gallstone in the gallbladder. IMPRESSION: 1. Increased echogenicity of the kidneys as can be seen in medical renal disease. 2. Nonobstructing left renal calculus. 3. Cholelithiasis. Electronically Signed   By: Greig Pique M.D.   On: 04/24/2024 22:01    Labs: BMET Recent Labs  Lab 04/23/24 1219 04/23/24 2204 04/24/24 0027 04/24/24 0453 04/24/24 0839 04/24/24 1508  NA 124* 125* 126* 128* 130*  --   K 6.7* 6.8* 6.5* 5.7* 6.2* 6.2*  CL 92* 92* 92* 92* 94*  --   CO2 17* 21* 19* 21* 17*  --   GLUCOSE 102* 108* 177* 89 92  --   BUN 105* 108* 108* 108* 109*  --   CREATININE 6.72* 7.05* 6.59* 7.13* 7.12*  --   CALCIUM  10.2 9.9 9.1 9.6 9.7  --   PHOS 6.4*  --   --   --   --   --    CBC Recent Labs  Lab 04/23/24 1219 04/24/24 0453 04/24/24 1458  WBC 13.4* 9.5 9.8  HGB 8.7* 7.5* 8.0*  HCT 27.2* 23.4* 24.6*  MCV 82.2 81.3 81.7  PLT 309 300 306    Medications:     amLODipine   10 mg Oral QAC breakfast   atorvastatin   20 mg Oral QAC breakfast   DULoxetine   60 mg Oral Daily   fluticasone  furoate-vilanterol  1 puff Inhalation Daily   gabapentin   300 mg Oral QHS   Gerhardt's butt cream   Topical Daily   lidocaine   2 patch Transdermal Q24H   pantoprazole  (PROTONIX ) IV  40 mg Intravenous Q12H   sodium chloride  flush  3 mL Intravenous Q12H   sodium zirconium cyclosilicate   10 g Oral TID   sucralfate   1 g Oral QID   traZODone   50 mg Oral QHS      Ephriam Stank, MD [facility] 04/25/2024, 7:47 AM

## 2024-04-25 NOTE — Progress Notes (Signed)
 [facility] PROGRESS NOTE    Progress Note  Brenda Schmidt  FMW:[id number] DOB: 04-02-1929 DOA: 04/23/2024 PCP: Luetta Edie Bi, MD     Brief Narrative:   Brenda Schmidt is an 89 y.o. female past medical history of essential hypertension PE and DVT on Xarelto , chronic kidney disease stage V not on dialysis, recently seen in the ED 3 days prior to admission for hyperkalemia and at that time she refused dialysis, she returned to [facility] health ED the day prior to admission for shortness of breath was found to be in pulmonary edema required BiPAP and she was also found hyperkalemic, treated conservatively the patient and her her healthcare power of attorney agreed to dialysis so she was transferred to [facility].  Assessment/Plan:   Chronic kidney disease stage V with pulmonary edema/metabolic acidosis and hyperkalemia: On admission she was found to have a potassium of 8.4 treated medically. Her potassium is still high at 6.2. Basic metabolic panel is pending this morning. Continue oral Lokelma . Renal ultrasound showed chronic medical renal disease. Nephrology was consulted who agreed with management he also recommended to continue Lokelma  and IV Lasix  for pulmonary edema. I agree with renal that it would be prudent for this patient to proceed with hospice care, due to her age and advanced comorbidities. Will get palliative care involved.  Acute on chronic respiratory failure with hypoxia: Likely due to pulmonary edema. She started on IV Lasix , urine output has been poorly recorded. We were able to wean her down to to her baseline of 2 L of oxygen. Lasix  per renal.  Anemia of chronic disease/questionable melanotic stools: Hemoglobin documented in chart in 2022 was 12. On admission 8.7 she is on Xarelto  at home because of her history of DVT. Repeated this morning was 8.0 and there was documentation of melanotic stools. Anticoagulation including DVT prophylaxis was  held. Risk and benefits discussed with the patient. Try to keep hemoglobin greater than 7. FOBT is pending.  Leukocytosis: Question reactive she has remained afebrile repeated this morning her white count is 9.5. Tmax is 98.6.  Sinus bradycardia: Resume Coreg , continue hydralazine  and Norvasc . Likely cause of her bradycardia was her acidosis.    Essential hypertension: Continue hydralazine  and amlodipine . Continue to hold the Coreg  for now as her blood pressure is in the 110's.  History of PEs and DVTs: Xarelto  was held on admission she was started on a heparin  drip. This was held as according to the staff she has been having small black tarry stools. On admission her hemoglobin was 8.5, repeated this morning 7.5. FOBT is pending.  Hyperlipidemia: Continue statins.  GERD: Continue Carafate .  DVT prophylaxis: scd Family Communication:brother in law Status is: Inpatient Remains inpatient appropriate because: Hyperkalemia, pulmonary edema metabolic acidosis    Code Status:     Code Status Orders  (From admission, onward)           Start     Ordered   04/23/24 1116  Do not attempt resuscitation (DNR) Pre-Arrest Interventions Desired  (Code Status)  Continuous       Question Answer Comment  If pulseless and not breathing No CPR or chest compressions.   In Pre-Arrest Conditions (Patient Has Pulse and Is Breathing) May intubate, use advanced airway interventions and cardioversion/ACLS medications if appropriate or indicated. May transfer to ICU.   Consent: Discussion documented in EHR or advanced directives reviewed      04/23/24 1115           Code Status History  Date Active Date Inactive Code Status Order ID Comments User Context   04/23/2024 1056 04/23/2024 1115 Full Code [id number]  Claudene Maximino LABOR, MD Inpatient   09/04/2018 0749 09/08/2018 1026 Full Code [id number]  Raenelle Coria, MD Inpatient   11/25/2012 1414 11/29/2012 1641 Full Code [id number]  Vernetta Lonni GRADE, MD Inpatient         IV Access:   Peripheral IV   Procedures and diagnostic studies:   US  RENAL Result Date: 04/24/2024 CLINICAL DATA:  Acute kidney injury EXAM: RENAL / URINARY TRACT ULTRASOUND COMPLETE COMPARISON:  Renal ultrasound 09/06/2014 FINDINGS: Right Kidney: Renal measurements: 10.5 x 6.1 x 5.7 cm = volume: 190 mL. Echogenicity is increased. No mass or hydronephrosis visualized. Left Kidney: Renal measurements: 9.5 x 4.1 x 4.9 cm = volume: 100 mL. Echogenicity is increased. There is a 9 mm calculus in the left kidney. No mass or hydronephrosis visualized. Bladder: Appears normal for degree of bladder distention. Other: Incidental 9 mm gallstone in the gallbladder. IMPRESSION: 1. Increased echogenicity of the kidneys as can be seen in medical renal disease. 2. Nonobstructing left renal calculus. 3. Cholelithiasis. Electronically Signed   By: Greig Pique M.D.   On: 04/24/2024 22:01     Medical Consultants:   None.   Subjective:    [name] has no complaints today.  Objective:    Vitals:   04/24/24 2153 04/24/24 2344 04/25/24 0355 04/25/24 0543  BP: (!) 174/66 131/62 (!) 163/73   Pulse: 70 69 87   Resp: 16 11 16    Temp: 98.4 F (36.9 C) 98 F (36.7 C) 97.6 F (36.4 C)   TempSrc: Oral Axillary Axillary   SpO2: 99% 98% 96%   Weight:    91.4 kg  Height:       SpO2: 96 % O2 Flow Rate (L/min): 2 L/min FiO2 (%): (!) 2 %   Intake/Output Summary (Last 24 hours) at 04/25/2024 0753 Last data filed at 04/25/2024 9372 Gross per 24 hour  Intake 0 ml  Output 1175 ml  Net -1175 ml   Filed Weights   04/23/24 1200 04/24/24 0300 04/25/24 0543  Weight: 91.2 kg 89 kg 91.4 kg    Exam: General exam: In no acute distress. Respiratory system: Good air movement and clear to auscultation. Cardiovascular system: S1 & S2 heard, RRR. No JVD. Gastrointestinal system: Abdomen is nondistended, soft and nontender.  Extremities: No pedal edema. Skin: No  rashes, lesions or ulcers Psychiatry: Judgement and insight appear normal. Mood & affect appropriate.  Data Reviewed:    Labs: Basic Metabolic Panel: Recent Labs  Lab 04/23/24 1219 04/23/24 2204 04/24/24 0027 04/24/24 0453 04/24/24 0839 04/24/24 1508  NA 124* 125* 126* 128* 130*  --   K 6.7* 6.8* 6.5* 5.7* 6.2* 6.2*  CL 92* 92* 92* 92* 94*  --   CO2 17* 21* 19* 21* 17*  --   GLUCOSE 102* 108* 177* 89 92  --   BUN 105* 108* 108* 108* 109*  --   CREATININE 6.72* 7.05* 6.59* 7.13* 7.12*  --   CALCIUM  10.2 9.9 9.1 9.6 9.7  --   PHOS 6.4*  --   --   --   --   --    GFR Estimated Creatinine Clearance: 5.3 mL/min (A) (by C-G formula based on SCr of 7.12 mg/dL (H)). Liver Function Tests: Recent Labs  Lab 04/23/24 1219  ALBUMIN 2.3*   No results for input(s): LIPASE, AMYLASE in the last 168 hours. No results for  input(s): AMMONIA in the last 168 hours. Coagulation profile Recent Labs  Lab 04/23/24 1219  INR 1.1   COVID-19 Labs  Recent Labs    04/25/24 0357  FERRITIN 228    No results found for: SARSCOV2NAA  CBC: Recent Labs  Lab 04/23/24 1219 04/24/24 0453 04/24/24 1458  WBC 13.4* 9.5 9.8  HGB 8.7* 7.5* 8.0*  HCT 27.2* 23.4* 24.6*  MCV 82.2 81.3 81.7  PLT 309 300 306   Cardiac Enzymes: No results for input(s): CKTOTAL, CKMB, CKMBINDEX, TROPONINI in the last 168 hours. BNP (last 3 results) No results for input(s): PROBNP in the last 8760 hours. CBG: No results for input(s): GLUCAP in the last 168 hours. D-Dimer: No results for input(s): DDIMER in the last 72 hours. Hgb A1c: No results for input(s): HGBA1C in the last 72 hours. Lipid Profile: No results for input(s): CHOL, HDL, LDLCALC, TRIG, CHOLHDL, LDLDIRECT in the last 72 hours. Thyroid function studies: No results for input(s): TSH, T4TOTAL, T3FREE, THYROIDAB in the last 72 hours.  Invalid input(s): FREET3 Anemia work up: Recent Labs     04/25/24 0357  FERRITIN 228  TIBC 224*  IRON  34   Sepsis Labs: Recent Labs  Lab 04/23/24 1219 04/24/24 0453 04/24/24 1458  WBC 13.4* 9.5 9.8   Microbiology No results found for this or any previous visit (from the past 240 hours).   Medications:    amLODipine   10 mg Oral QAC breakfast   atorvastatin   20 mg Oral QAC breakfast   DULoxetine   60 mg Oral Daily   fluticasone  furoate-vilanterol  1 puff Inhalation Daily   gabapentin   300 mg Oral QHS   Gerhardt's butt cream   Topical Daily   lidocaine   2 patch Transdermal Q24H   pantoprazole  (PROTONIX ) IV  40 mg Intravenous Q12H   sodium chloride  flush  3 mL Intravenous Q12H   sodium zirconium cyclosilicate   10 g Oral TID   sucralfate   1 g Oral QID   traZODone   50 mg Oral QHS   Continuous Infusions:      LOS: 2 days   Erle Odell Castor  [facility]  04/25/2024, 7:53 AM

## 2024-04-26 DIAGNOSIS — Z7189 Other specified counseling: Secondary | ICD-10-CM | POA: Diagnosis not present

## 2024-04-26 DIAGNOSIS — N186 End stage renal disease: Secondary | ICD-10-CM | POA: Diagnosis not present

## 2024-04-26 DIAGNOSIS — Z515 Encounter for palliative care: Secondary | ICD-10-CM | POA: Diagnosis not present

## 2024-04-26 DIAGNOSIS — Z66 Do not resuscitate: Secondary | ICD-10-CM | POA: Diagnosis not present

## 2024-04-26 LAB — BASIC METABOLIC PANEL WITH GFR
Anion gap: 17 — ABNORMAL HIGH (ref 5–15)
BUN: 123 mg/dL — ABNORMAL HIGH (ref 8–23)
CO2: 20 mmol/L — ABNORMAL LOW (ref 22–32)
Calcium: 9.4 mg/dL (ref 8.9–10.3)
Chloride: 93 mmol/L — ABNORMAL LOW (ref 98–111)
Creatinine, Ser: 7.92 mg/dL — ABNORMAL HIGH (ref 0.44–1.00)
GFR, Estimated: 4 mL/min — ABNORMAL LOW (ref >60)
Glucose, Bld: 97 mg/dL (ref 70–99)
Potassium: 5.3 mmol/L — ABNORMAL HIGH (ref 3.5–5.1)
Sodium: 130 mmol/L — ABNORMAL LOW (ref 135–145)

## 2024-04-26 LAB — URINALYSIS, ROUTINE W REFLEX MICROSCOPIC
Bilirubin Urine: NEGATIVE
Glucose, UA: NEGATIVE mg/dL
Ketones, ur: NEGATIVE mg/dL
Nitrite: NEGATIVE
Protein, ur: >=300 mg/dL — AB
RBC / HPF: >50 RBC/hpf (ref 0–5)
Specific Gravity, Urine: 1.015 (ref 1.005–1.030)
WBC, UA: >50 WBC/hpf (ref 0–5)
pH: 9 — ABNORMAL HIGH (ref 5.0–8.0)

## 2024-04-26 MED ORDER — TORSEMIDE 40 MG PO TABS: 40.0000 mg | ORAL_TABLET | Freq: Two times a day (BID) | ORAL | 0 refills | Status: DC

## 2024-04-26 MED ORDER — SODIUM ZIRCONIUM CYCLOSILICATE 10 G PO PACK: 10.0000 g | PACK | Freq: Three times a day (TID) | ORAL | 0 refills | Status: DC

## 2024-04-26 MED ORDER — FUROSEMIDE 10 MG/ML IJ SOLN
100.0000 mg | Freq: Once | INTRAVENOUS | Status: AC
  Administered 2024-04-26: 100 mg via INTRAVENOUS
  Filled 2024-04-26: qty 10

## 2024-04-26 NOTE — Progress Notes (Signed)
 Report given to Janet,LPN of [employer] questions were answered

## 2024-04-26 NOTE — NC FL2 (Signed)
 [place]  MEDICAID FL2 LEVEL OF CARE FORM     IDENTIFICATION  Patient Name: Brenda Schmidt Birthdate: 1928/11/30 Sex: female Admission Date (Current Location): 04/23/2024  [facility] and [place] Number:  Producer, television/film/video and Address:  [facility], [address] [place], [place] [address]      Provider Number: [id number]  Attending Physician Name and Address:  Cheryle Page, MD  Relative Name and Phone Number:       Current Level of Care: Hospital Recommended Level of Care: Skilled Nursing Facility Prior Approval Number:    Date Approved/Denied:   PASRR Number:    Discharge Plan: SNF    Current Diagnoses: Patient Active Problem List   Diagnosis Date Noted   ESRD (end stage renal disease) (HCC) 04/23/2024   Hyperkalemia 04/23/2024   Acute on chronic respiratory failure with hypoxia (HCC) 04/23/2024   Leukocytosis 04/23/2024   History of DVT (deep vein thrombosis) 04/23/2024   History of pulmonary embolism 04/23/2024   AKI (acute kidney injury) (HCC) 09/05/2018   Hip fracture (HCC) 09/04/2018   UTI (urinary tract infection) 09/04/2018   Rhabdomyolysis 09/04/2018   Contusion of hip, left 09/04/2018   History of total right hip arthroplasty 12/13/2017   Muscle spasm 12/08/2012   Essential hypertension, benign 12/08/2012   Anemia of chronic disease 12/08/2012   GERD (gastroesophageal reflux disease) 12/08/2012   Hyperlipidemia 12/08/2012   Degenerative arthritis of hip 11/25/2012    Orientation RESPIRATION BLADDER Height & Weight     Self, Situation, Place  Normal, O2 Incontinent, External catheter Weight: 201 lb 4.5 oz (91.3 kg) Height:  5' 3 (160 cm)  BEHAVIORAL SYMPTOMS/MOOD NEUROLOGICAL BOWEL NUTRITION STATUS      Incontinent Diet (please see discharge summary)  AMBULATORY STATUS COMMUNICATION OF NEEDS Skin     Verbally Other (Comment) (wounds, pernieum)                       Personal Care Assistance Level of Assistance   Bathing, Feeding, Dressing Bathing Assistance: Maximum assistance Feeding assistance: Limited assistance Dressing Assistance: Maximum assistance     Functional Limitations Info  Sight, Speech, Hearing Sight Info: Impaired (glasses) Hearing Info: Impaired Speech Info: Adequate    SPECIAL CARE FACTORS FREQUENCY                       Contractures Contractures Info: Not present    Additional Factors Info  Code Status, Allergies Code Status Info: DNR-Intervention Allergies Info: NKA           Current Medications (04/26/2024):  This is the current hospital active medication list Current Facility-Administered Medications  Medication Dose Route Frequency Provider Last Rate Last Admin   acetaminophen  (TYLENOL ) tablet 650 mg  650 mg Oral Q6H PRN Smith, Rondell A, MD   650 mg at 04/25/24 1528   Or   acetaminophen  (TYLENOL ) suppository 650 mg  650 mg Rectal Q6H PRN Smith, Rondell A, MD       albuterol  (PROVENTIL ) (2.5 MG/3ML) 0.083% nebulizer solution 2.5 mg  2.5 mg Nebulization Q6H PRN Claudene, Rondell A, MD   2.5 mg at 04/25/24 1501   amLODipine  (NORVASC ) tablet 10 mg  10 mg Oral QAC breakfast Claudene Reeves A, MD   10 mg at 04/26/24 0809   atorvastatin  (LIPITOR) tablet 20 mg  20 mg Oral QAC breakfast Smith, Rondell A, MD   20 mg at 04/26/24 0809   carvedilol  (COREG ) tablet 3.125 mg  3.125 mg Oral Daily Odell Celinda Balo, MD   3.125 mg at 04/26/24 0813   DULoxetine  (CYMBALTA ) DR capsule 60 mg  60 mg Oral Daily Smith, Rondell A, MD   60 mg at 04/26/24 0813   fluticasone  furoate-vilanterol (BREO ELLIPTA ) 200-25 MCG/ACT 1 puff  1 puff Inhalation Daily Claudene Reeves A, MD   1 puff at 04/26/24 0801   gabapentin  (NEURONTIN ) capsule 300 mg  300 mg Oral QHS Smith, Rondell A, MD   300 mg at 04/25/24 2157   Gerhardt's butt cream   Topical Daily Claudene Reeves LABOR, MD   Given at 04/26/24 9167   hydrALAZINE  (APRESOLINE ) injection 10 mg  10 mg Intravenous Q4H PRN Claudene Reeves A, MD        lidocaine  (LIDODERM ) 5 % 2 patch  2 patch Transdermal Q24H Claudene Reeves A, MD   2 patch at 04/26/24 0831   morphine  (PF) 2 MG/ML injection 2 mg  2 mg Intravenous Q2H PRN Odell Celinda Balo, MD       pantoprazole  (PROTONIX ) injection 40 mg  40 mg Intravenous Q12H Rathore, Vasundhra, MD   40 mg at 04/26/24 0831   sodium chloride  flush (NS) 0.9 % injection 3 mL  3 mL Intravenous Q12H Smith, Rondell A, MD   3 mL at 04/26/24 9167   sodium zirconium cyclosilicate  (LOKELMA ) packet 10 g  10 g Oral TID Odell Celinda Balo, MD   10 g at 04/26/24 0813   sucralfate  (CARAFATE ) tablet 1 g  1 g Oral QID Smith, Rondell A, MD   1 g at 04/26/24 0813   traZODone  (DESYREL ) tablet 50 mg  50 mg Oral QHS Smith, Rondell A, MD   50 mg at 04/25/24 2157     Discharge Medications: Please see discharge summary for a list of discharge medications.  Relevant Imaging Results:  Relevant Lab Results:   Additional Information SSN # [id number]  Montie LOISE Louder, LCSW

## 2024-04-26 NOTE — Progress Notes (Signed)
 [facility] Progress Note    Assessment/ Plan:   CKD5 -followed by [facility] in [place]. Has a history of what sounds like nephrolithiasis, renal u/s without any acute findings -hold ARB-thiazide moving forward -no indications for renal replacement therapy yet however labs are pending this AM (discussed with RN). Overall, would not recommend dialysis in this 89 year old patient. Would be concerned for further diminishing quality of life.  -has been seen by pall care (appreciated). Plan to transfer back to LTC with hospice. Family and patient are in agreement with NOT doing dialysis -noticed that morphine  is on her med list as of 9/2--recommend using alternative agents as below -Avoid nephrotoxic medications including NSAIDs and iodinated intravenous contrast exposure unless the latter is absolutely indicated.  Preferred narcotic agents for pain control are hydromorphone , fentanyl , and methadone. Morphine  should not be used. Avoid Baclofen and avoid oral sodium phosphate  and magnesium  citrate based laxatives / bowel preps. Continue strict Input and Output monitoring. Will monitor the patient closely with you and intervene or adjust therapy as indicated by changes in clinical status/labs   Hyperkalemia -c/w lokelma  10g TID. K down to 5.3 finally -hopefully addressing her urinary retention will help  Acute on chronic hypoxic respiratory failure Acute on chronic diastolic CHF exacerbation -last echo in 2020, consider repeat if needed -monitor strict I/O and daily weights -will redose lasix  mainly from a respiratory/comfort stand point therefore, can dc on torsemide  40mg  BID  Hypervolemic hyponatremia -improving, up to 130 with diuresis  Urinary retention -bladder scan q6h, I/O, consider foley if continuously retaining  Anemia Melanotic stool -transfuse PRN for Hgb <7 -FOBT+, hep gtt on hold -on PPI -s/p venofer  200mg  x 1 dose 9/2, holding further doses since  hospice is anticipated   Acidosis -watch for now, if downtrending then can consider adding on nahco3 supplementation, stable  HTN -reasonably controlled given her age -lasix  as above  H/o PE/DVT -off heparin  gtt given c/f GIB, per primary  Discussed with primary service. Nothing else to offer from an inpatient nephrology perspective. Seems that she will be discharging back to LTC with hospice. Will sign off. Please call with any questions/concerns in the interim.  Ephriam Stank, MD [facility]  Subjective:   Patient seen and examined bedside. No acute events. Patient confirms to me this AM that she does not want dialysis.   Objective:   BP (!) 151/46   Pulse 76   Temp 97.7 F (36.5 C) (Oral)   Resp 17   Ht 5' 3 (1.6 m)   Wt 91.3 kg   SpO2 95%   BMI 35.66 kg/m   Intake/Output Summary (Last 24 hours) at 04/26/2024 9177 Last data filed at 04/26/2024 0550 Gross per 24 hour  Intake 120 ml  Output 230 ml  Net -110 ml   Weight change: -0.1 kg  Physical Exam: Gen: NAD,  CVS:RRR Resp: decreased breath sounds bibasilar with fine rales Abd: soft, nt/nd Ext: no significant edema b/l Les Neuro: awake, alert, no myoclonic jerking observed, very hard of hearing  Imaging: US  RENAL Result Date: 04/24/2024 CLINICAL DATA:  Acute kidney injury EXAM: RENAL / URINARY TRACT ULTRASOUND COMPLETE COMPARISON:  Renal ultrasound 09/06/2014 FINDINGS: Right Kidney: Renal measurements: 10.5 x 6.1 x 5.7 cm = volume: 190 mL. Echogenicity is increased. No mass or hydronephrosis visualized. Left Kidney: Renal measurements: 9.5 x 4.1 x 4.9 cm = volume: 100 mL. Echogenicity is increased. There is a 9 mm calculus in the left kidney. No  mass or hydronephrosis visualized. Bladder: Appears normal for degree of bladder distention. Other: Incidental 9 mm gallstone in the gallbladder. IMPRESSION: 1. Increased echogenicity of the kidneys as can be seen in medical renal disease. 2. Nonobstructing left  renal calculus. 3. Cholelithiasis. Electronically Signed   By: Greig Pique M.D.   On: 04/24/2024 22:01    Labs: BMET Recent Labs  Lab 04/23/24 1219 04/23/24 2204 04/24/24 0027 04/24/24 0453 04/24/24 0839 04/24/24 1508 04/25/24 0835 04/26/24 0325  NA 124* 125* 126* 128* 130*  --  128* 130*  K 6.7* 6.8* 6.5* 5.7* 6.2* 6.2* 6.2* 5.3*  CL 92* 92* 92* 92* 94*  --  94* 93*  CO2 17* 21* 19* 21* 17*  --  18* 20*  GLUCOSE 102* 108* 177* 89 92  --  80 97  BUN 105* 108* 108* 108* 109*  --  119* 123*  CREATININE 6.72* 7.05* 6.59* 7.13* 7.12*  --  7.61* 7.92*  CALCIUM  10.2 9.9 9.1 9.6 9.7  --  9.7 9.4  PHOS 6.4*  --   --   --   --   --  8.0*  --    CBC Recent Labs  Lab 04/23/24 1219 04/24/24 0453 04/24/24 1458 04/25/24 1051  WBC 13.4* 9.5 9.8 10.9*  HGB 8.7* 7.5* 8.0* 8.4*  HCT 27.2* 23.4* 24.6* 26.0*  MCV 82.2 81.3 81.7 82.0  PLT 309 300 306 335    Medications:     amLODipine   10 mg Oral QAC breakfast   atorvastatin   20 mg Oral QAC breakfast   carvedilol   3.125 mg Oral Daily   DULoxetine   60 mg Oral Daily   fluticasone  furoate-vilanterol  1 puff Inhalation Daily   gabapentin   300 mg Oral QHS   Gerhardt's butt cream   Topical Daily   lidocaine   2 patch Transdermal Q24H   pantoprazole  (PROTONIX ) IV  40 mg Intravenous Q12H   sodium chloride  flush  3 mL Intravenous Q12H   sodium zirconium cyclosilicate   10 g Oral TID   sucralfate   1 g Oral QID   traZODone   50 mg Oral QHS      Ephriam Stank, MD [facility] 04/26/2024, 8:22 AM

## 2024-04-26 NOTE — Discharge Summary (Signed)
 Physician Discharge Summary  Brenda Schmidt FMW:[id number] DOB: [birth year] DOA: 04/23/2024  PCP: Luetta Edie Bi, MD  Admit date: 04/23/2024 Discharge date: 04/26/2024  Admitted From: Long-term care facility  disposition: Long-term care facility   Recommendations for Outpatient Follow-up:  Follow up with PCP in 1 week with repeat CBC/BMP Outpatient follow-up with palliative care Follow up in ED if symptoms worsen or new appear   Home Health: No Equipment/Devices: None  Discharge Condition: Stable CODE STATUS: Full Diet recommendation: Heart healthy  Brief/Interim Summary: 89 y.o. female past medical history of essential hypertension PE and DVT on Xarelto , chronic kidney disease stage V not on dialysis, recently seen in the ED 3 days prior to admission for hyperkalemia and at that time she refused dialysis, she returned to [facility] health ED the day prior to admission for shortness of breath; was found to be in pulmonary edema required BiPAP and she was also found to be hyperkalemic, treated conservatively. The patient and her healthcare power of attorney agreed to dialysis so she was transferred to [facility].  Nephrology was consulted who recommended against initiation of hemodialysis.  Patient now agreeable for the same.  Palliative care was subsequently consulted.  Patient and healthcare power of attorney agreeable for patient to return to long-term care facility.  Nephrology recommended to use torsemide  40 mg twice a day for now.  Discharge patient to long-term care facility today.   Discharge Diagnoses:   CKD stage V Hyperkalemia Acute metabolic acidosis Acute pulmonary edema Acute on chronic hypoxic respiratory failure -Presented with pulmonary edema requiring BiPAP along with hyperkalemia. -The patient and her healthcare power of attorney agreed to dialysis so she was transferred to [facility].  Nephrology was consulted who recommended against initiation of  hemodialysis.  Patient now agreeable for the same.  Palliative care was subsequently consulted.  Patient and healthcare power of attorney agreeable for patient to return to long-term care facility.  Nephrology recommended to use torsemide  40 mg twice a day for now.  Discharge patient to long-term care facility today. -Outpatient follow-up with palliative care -Bicarbonate 20 today.  Potassium 5.3 today.  Continue Lokelma  on discharge.  Hyponatremia Possibly from volume overload.  Improving.  Anemia of chronic disease - From chronic illnesses.  Hemoglobin stable.  Monitor intermittently  History of DVT and PE - Patient currently not on Xarelto .  Outpatient follow-up  Essential hypertension Hyperlipidemia --continue amlodipine , Coreg .  Olmesartan  hydrochlorothiazide  has been stopped - Continue statin  GERD - Continue Carafate   Obesity class I - Outpatient follow-up   Discharge Instructions  Discharge Instructions     Diet - low sodium heart healthy   Complete by: As directed    Increase activity slowly   Complete by: As directed    No wound care   Complete by: As directed       Allergies as of 04/26/2024   No Known Allergies      Medication List     STOP taking these medications    furosemide  20 MG tablet Commonly known as: LASIX    olmesartan -hydrochlorothiazide  40-25 MG tablet Commonly known as: BENICAR  HCT   predniSONE 20 MG tablet Commonly known as: DELTASONE       TAKE these medications    acetaminophen  325 MG tablet Commonly known as: TYLENOL  Take 650 mg by mouth every 8 (eight) hours as needed for fever (pain).   acetaminophen  500 MG tablet Commonly known as: TYLENOL  Take 1,000 mg by mouth 3 (three) times daily.   albuterol  108 (  90 Base) MCG/ACT inhaler Commonly known as: VENTOLIN  HFA Inhale 2 puffs into the lungs every 6 (six) hours as needed for wheezing or shortness of breath.   amLODipine  5 MG tablet Commonly known as: NORVASC  Take 10  mg by mouth daily.   atorvastatin  20 MG tablet Commonly known as: LIPITOR Take 20 mg by mouth at bedtime.   carvedilol  6.25 MG tablet Commonly known as: COREG  Take 6.25 mg by mouth daily.   cyanocobalamin 1000 MCG tablet Commonly known as: VITAMIN B12 Take 1,000 mcg by mouth daily.   docusate sodium  100 MG capsule Commonly known as: COLACE Take 100 mg by mouth 2 (two) times daily.   DULoxetine  60 MG capsule Commonly known as: CYMBALTA  Take 60 mg by mouth daily.   ferrous sulfate 325 (65 FE) MG tablet Take 325 mg by mouth daily at 6 PM.   fluticasone -salmeterol 250-50 MCG/ACT Aepb Commonly known as: ADVAIR Inhale 1 puff into the lungs in the morning and at bedtime.   gabapentin  300 MG capsule Commonly known as: NEURONTIN  Take 300 mg by mouth at bedtime.   hydrALAZINE  50 MG tablet Commonly known as: APRESOLINE  Take 50 mg by mouth 3 (three) times daily.   lactose free nutrition Liqd Take 237 mLs by mouth daily.   lidocaine  5 % Commonly known as: LIDODERM  Place 2 patches onto the skin daily. Remove after 12 hours.   MILK OF MAGNESIA PO Take 30 mLs by mouth daily as needed (constipation).   OXYGEN Inhale 2-4 L/min into the lungs See admin instructions. 2-4 L/min via nasal cannula/mask as needed for hypoxia, shortness of breath, every shift.   polyethylene glycol powder 17 GM/SCOOP powder Commonly known as: GLYCOLAX /MIRALAX  Take 17 g by mouth at bedtime.   sodium zirconium cyclosilicate  10 g Pack packet Commonly known as: LOKELMA  Take 10 g by mouth 3 (three) times daily.   sucralfate  1 g tablet Commonly known as: CARAFATE  Take 1 g by mouth 4 (four) times daily -  before meals and at bedtime.   Torsemide  40 MG Tabs Take 40 mg by mouth 2 (two) times daily.   traZODone  50 MG tablet Commonly known as: DESYREL  Take 50 mg by mouth at bedtime.   triamcinolone lotion 0.1 % Commonly known as: KENALOG Apply 1 Application topically 2 (two) times daily. Apply for  2 weeks to rash   Vitamin D  (Ergocalciferol ) 1.25 MG (50000 UNIT) Caps capsule Commonly known as: DRISDOL  Take 50,000 Units by mouth every Wednesday.   Voltaren 1 % Gel Generic drug: diclofenac Sodium Apply 1 Application topically 3 (three) times daily. Apply to bilateral knees and ankles        No Known Allergies  Consultations: Nephrology/palliative care   Procedures/Studies: US  RENAL Result Date: 04/24/2024 CLINICAL DATA:  Acute kidney injury EXAM: RENAL / URINARY TRACT ULTRASOUND COMPLETE COMPARISON:  Renal ultrasound 09/06/2014 FINDINGS: Right Kidney: Renal measurements: 10.5 x 6.1 x 5.7 cm = volume: 190 mL. Echogenicity is increased. No mass or hydronephrosis visualized. Left Kidney: Renal measurements: 9.5 x 4.1 x 4.9 cm = volume: 100 mL. Echogenicity is increased. There is a 9 mm calculus in the left kidney. No mass or hydronephrosis visualized. Bladder: Appears normal for degree of bladder distention. Other: Incidental 9 mm gallstone in the gallbladder. IMPRESSION: 1. Increased echogenicity of the kidneys as can be seen in medical renal disease. 2. Nonobstructing left renal calculus. 3. Cholelithiasis. Electronically Signed   By: Greig Pique M.D.   On: 04/24/2024 22:01  Subjective: Patient seen and examined at bedside.  Slow to respond.  Poor historian.  No fever, vomiting, worsening abdominal pain reported.  Discharge Exam: Vitals:   04/26/24 0753 04/26/24 0809  BP: (!) 151/46 (!) 151/46  Pulse: 76   Resp: 17   Temp: 97.7 F (36.5 C)   SpO2: 95%     General: Elderly female lying in bed.  Currently on 3 L oxygen by nasal cannula cardiovascular: rate controlled, S1/S2 + Respiratory: bilateral decreased breath sounds at bases with scattered crackles Abdominal: Soft, obese, NT, ND, bowel sounds + Extremities: Trace lower extremity edema; no cyanosis    The results of significant diagnostics from this hospitalization (including imaging, microbiology,  ancillary and laboratory) are listed below for reference.     Microbiology: No results found for this or any previous visit (from the past 240 hours).   Labs: BNP (last 3 results) Recent Labs    04/23/24 1219  BNP 1,112.5*   Basic Metabolic Panel: Recent Labs  Lab 04/23/24 1219 04/23/24 2204 04/24/24 0027 04/24/24 0453 04/24/24 0839 04/24/24 1508 04/25/24 0835 04/26/24 0325  NA 124*   < > 126* 128* 130*  --  128* 130*  K 6.7*   < > 6.5* 5.7* 6.2* 6.2* 6.2* 5.3*  CL 92*   < > 92* 92* 94*  --  94* 93*  CO2 17*   < > 19* 21* 17*  --  18* 20*  GLUCOSE 102*   < > 177* 89 92  --  80 97  BUN 105*   < > 108* 108* 109*  --  119* 123*  CREATININE 6.72*   < > 6.59* 7.13* 7.12*  --  7.61* 7.92*  CALCIUM  10.2   < > 9.1 9.6 9.7  --  9.7 9.4  PHOS 6.4*  --   --   --   --   --  8.0*  --    < > = values in this interval not displayed.   Liver Function Tests: Recent Labs  Lab 04/23/24 1219 04/25/24 0835  ALBUMIN 2.3* 2.2*   No results for input(s): LIPASE, AMYLASE in the last 168 hours. No results for input(s): AMMONIA in the last 168 hours. CBC: Recent Labs  Lab 04/23/24 1219 04/24/24 0453 04/24/24 1458 04/25/24 1051  WBC 13.4* 9.5 9.8 10.9*  HGB 8.7* 7.5* 8.0* 8.4*  HCT 27.2* 23.4* 24.6* 26.0*  MCV 82.2 81.3 81.7 82.0  PLT 309 300 306 335   Cardiac Enzymes: No results for input(s): CKTOTAL, CKMB, CKMBINDEX, TROPONINI in the last 168 hours. BNP: Invalid input(s): POCBNP CBG: No results for input(s): GLUCAP in the last 168 hours. D-Dimer No results for input(s): DDIMER in the last 72 hours. Hgb A1c No results for input(s): HGBA1C in the last 72 hours. Lipid Profile No results for input(s): CHOL, HDL, LDLCALC, TRIG, CHOLHDL, LDLDIRECT in the last 72 hours. Thyroid function studies No results for input(s): TSH, T4TOTAL, T3FREE, THYROIDAB in the last 72 hours.  Invalid input(s): FREET3 Anemia work up Recent Labs     04/25/24 0357  FERRITIN 228  TIBC 224*  IRON  34   Urinalysis    Component Value Date/Time   COLORURINE AMBER (A) 04/26/2024 0557   APPEARANCEUR CLOUDY (A) 04/26/2024 0557   LABSPEC 1.015 04/26/2024 0557   PHURINE 9.0 (H) 04/26/2024 0557   GLUCOSEU NEGATIVE 04/26/2024 0557   HGBUR SMALL (A) 04/26/2024 0557   BILIRUBINUR NEGATIVE 04/26/2024 0557   KETONESUR NEGATIVE 04/26/2024 0557   PROTEINUR >=300 (  A) 04/26/2024 0557   UROBILINOGEN 0.2 08/20/2013 0750   NITRITE NEGATIVE 04/26/2024 0557   LEUKOCYTESUR LARGE (A) 04/26/2024 0557   Sepsis Labs Recent Labs  Lab 04/23/24 1219 04/24/24 0453 04/24/24 1458 04/25/24 1051  WBC 13.4* 9.5 9.8 10.9*   Microbiology No results found for this or any previous visit (from the past 240 hours).   Time coordinating discharge: 35 minutes  SIGNED:   Sophie Mao, MD  [facility] 04/26/2024, 11:22 AM

## 2024-04-26 NOTE — Progress Notes (Signed)
 Daily Progress Note   Patient Name: Brenda Schmidt       Date: 04/26/2024 DOB: 05-02-1929  Age: 89 y.o. MRN#: [id number] Attending Physician: Cheryle Page, MD Primary Care Physician: Luetta Edie Bi, MD Admit Date: 04/23/2024  Reason for Consultation/Follow-up: Establishing goals of care  Subjective: I have reviewed medical records including EPIC notes, MAR, any available advanced directives as necessary, and labs. Creatinine continues to worsen - noted 7.92 today. Received report from primary RN - no acute concerns.   Went to visit patient at bedside - no family/visitors present. Patient was lying in bed awake and alert. No signs or non-verbal gestures of pain or discomfort noted. No respiratory distress, increased work of breathing, or secretions noted. She is on 3L O2 [place]. She denies pain or shortness of breath.  Assisted patient in repositioning per her request. She has no follow up concerns or questions after GOC yesterday. Goals are clear for discharge back to [facility] with hospice.  All questions and concerns addressed. Encouraged to call with questions and/or concerns. PMT card previously provided.  Length of Stay: 3  Current Medications: Scheduled Meds:   amLODipine   10 mg Oral QAC breakfast   atorvastatin   20 mg Oral QAC breakfast   carvedilol   3.125 mg Oral Daily   DULoxetine   60 mg Oral Daily   fluticasone  furoate-vilanterol  1 puff Inhalation Daily   gabapentin   300 mg Oral QHS   Gerhardt's butt cream   Topical Daily   lidocaine   2 patch Transdermal Q24H   pantoprazole  (PROTONIX ) IV  40 mg Intravenous Q12H   sodium chloride  flush  3 mL Intravenous Q12H   sodium zirconium cyclosilicate   10 g Oral TID   sucralfate   1 g Oral QID   traZODone   50 mg Oral QHS     Continuous Infusions:  furosemide  100 mg (04/26/24 1008)    PRN Meds: acetaminophen  **OR** acetaminophen , albuterol , hydrALAZINE , morphine  (PF)  Physical Exam Vitals and nursing note reviewed.  Constitutional:      General: She is not in acute distress. Pulmonary:     Effort: No respiratory distress.  Skin:    General: Skin is warm and dry.  Neurological:     Mental Status: She is alert and oriented to person, place, and time.     Motor: Weakness present.  Psychiatric:        Attention and Perception: Attention normal.        Behavior: Behavior is cooperative.        Cognition and Memory: Cognition and memory normal.             Vital Signs: BP (!) 151/46   Pulse 76   Temp 97.7 F (36.5 C) (Oral)   Resp 17   Ht 5' 3 (1.6 m)   Wt 91.3 kg   SpO2 95%   BMI 35.66 kg/m  SpO2: SpO2: 95 % O2 Device: O2 Device: Nasal Cannula O2 Flow Rate: O2 Flow Rate (L/min): 3 L/min  Intake/output summary:  Intake/Output Summary (Last 24 hours) at 04/26/2024 1101 Last data filed at 04/26/2024 0915 Gross per 24 hour  Intake 240 ml  Output 480 ml  Net -240 ml   LBM: Last BM Date : 04/25/24 Baseline Weight: Weight: 91.2 kg Most recent weight: Weight: 91.3 kg       Palliative Assessment/Data: PPS 30%      Patient Active Problem List   Diagnosis Date Noted   ESRD (end stage renal disease) (HCC) 04/23/2024   Hyperkalemia 04/23/2024   Acute on chronic respiratory failure with hypoxia (HCC) 04/23/2024   Leukocytosis 04/23/2024   History of DVT (deep vein thrombosis) 04/23/2024   History of pulmonary embolism 04/23/2024   AKI (acute kidney injury) (HCC) 09/05/2018   Hip fracture (HCC) 09/04/2018   UTI (urinary tract infection) 09/04/2018   Rhabdomyolysis 09/04/2018   Contusion of hip, left 09/04/2018   History of total right hip arthroplasty 12/13/2017   Muscle spasm 12/08/2012   Essential hypertension, benign 12/08/2012   Anemia of chronic disease 12/08/2012   GERD  (gastroesophageal reflux disease) 12/08/2012   Hyperlipidemia 12/08/2012   Degenerative arthritis of hip 11/25/2012    Palliative Care Assessment & Plan   Patient Profile: 89 y.o. female  with past medical history of hypertension, hyperlipidemia, PE/DVT, CVA, hard of hearing, chronic kidney disease stage V not on dialysis was admitted on 04/23/2024 with acute on chronic respiratory failure with hypoxia (wears baseline 2L O2 [place]), CKD V/metabolic acidosis/hyperkalemia, leukocytosis, and bradycardia. She required BiPAP in ED. Nephrology was consulted and would not recommend dialysis in this 89 year old. There are no acute indications for renal replacement at this time. Hospital course has been complicated by questionable melanotic stools on Xarelto  - FOBT pending.   Assessment: Principal Problem:   ESRD (end stage renal disease) (HCC) Active Problems:   Essential hypertension, benign   Anemia of chronic disease   Hyperlipidemia   Hyperkalemia   Acute on chronic respiratory failure with hypoxia (HCC)   Leukocytosis   History of DVT (deep vein thrombosis)   History of pulmonary embolism   Concern about end of life  Recommendations/Plan: Continue gentle supportive medical care with comfort focus - goal is to keep patient stable for transfer back to LTC with hospice  Patient and her brother/HCPOA are hopeful for her discharge today 9/3 Continue DNR/DNI as previously documented  Patient is not interested in pursuing dialysis or further aggressive interventions PMT will continue to follow and support holistically  Goals of Care and Additional Recommendations: Limitations on Scope of Treatment: Avoid Hospitalization, No Hemodialysis, and No Tracheostomy  Code Status:    Code Status Orders  (From admission, onward)           Start     Ordered   04/23/24 1116  Do not attempt resuscitation (DNR) Pre-Arrest Interventions  Desired  (Code Status)  Continuous       Question Answer Comment   If pulseless and not breathing No CPR or chest compressions.   In Pre-Arrest Conditions (Patient Has Pulse and Is Breathing) May intubate, use advanced airway interventions and cardioversion/ACLS medications if appropriate or indicated. May transfer to ICU.   Consent: Discussion documented in EHR or advanced directives reviewed      04/23/24 1115           Code Status History     Date Active Date Inactive Code Status Order ID Comments User Context   04/23/2024 1056 04/23/2024 1115 Full Code [id number]  Claudene Maximino LABOR, MD Inpatient   09/04/2018 0749 09/08/2018 1026 Full Code [id number]  Raenelle Coria, MD Inpatient   11/25/2012 1414 11/29/2012 1641 Full Code [id number]  Vernetta Lonni GRADE, MD Inpatient      Advance Directive Documentation    Flowsheet Row Most Recent Value  Type of Advance Directive Healthcare Power of Attorney  Pre-existing out of facility DNR order (yellow form or pink MOST form) --  MOST Form in Place? --    Prognosis:  < 6 months  Discharge Planning: Skilled Nursing Facility with Hospice  Care plan was discussed with primary RN, TOC, Dr. Cheryle  Thank you for allowing the Palliative Medicine Team to assist in the care of this patient.     Jeoffrey CHRISTELLA Claudene, NP  Please contact Palliative Medicine Team phone at [phone] for questions and concerns.   *Portions of this note are a verbal dictation therefore any spelling and/or grammatical errors are due to the Dragon Medical One system interpretation.

## 2024-04-26 NOTE — Progress Notes (Signed)
 Urinalysis sent this AM. Shows probable UTI.

## 2024-04-26 NOTE — TOC Progression Note (Signed)
 Transition of Care [facility]) - Progression Note    Patient Details  Name: Brenda Schmidt MRN: [id number] Date of Birth: July 10, 1929  [facility] CM/SW Contact  Montie LOISE Louder, [place] Phone Number: 04/26/2024, 10:00 AM  Clinical Narrative:      CSW called [facility], Left voice message to return call. -waiting on response   Montie Louder, MSW, LCSW Clinical Social Worker                     Expected Discharge Plan and Services                                               Social Drivers of Health (SDOH) Interventions SDOH Screenings   Food Insecurity: No Food Insecurity (04/24/2024)  Housing: Low Risk  (04/24/2024)  Transportation Needs: No Transportation Needs (04/24/2024)  Utilities: Not At Risk (04/24/2024)  Social Connections: Socially Isolated (04/24/2024)  Tobacco Use: Low Risk  (04/24/2024)    Readmission Risk Interventions     No data to display

## 2024-04-26 NOTE — TOC Transition Note (Signed)
 Transition of Care [facility]) - Discharge Note   Patient Details  Name: Brenda Schmidt MRN: [id number] Date of Birth: [birth year]  [facility] CM/SW Contact:  Montie LOISE Louder, LCSW Phone Number: 04/26/2024, 1:17 PM   Clinical Narrative:      Patient will Discharge to: [facility] Discharge Date: 04/26/24 Family Notified: Left VM for brother  Transport Ab:[facility]  Per MD patient is ready for discharge. RN, patient, and facility notified of discharge. Discharge Summary sent to facility. RN given number for [phone]. Ambulance transport requested for patient.   Clinical Social Worker signing off.  Montie Louder, MSW, LCSW Clinical Social Worker      Barriers to Discharge: Barriers Resolved   Patient Goals and CMS Choice            Discharge Placement              Patient chooses bed at: [facility] Patient to be transferred to facility by: [facility] Name of family member notified: LVM w/ HCPOA brother Patient and family notified of of transfer: 04/26/24  Discharge Plan and Services Additional resources added to the After Visit Summary for                                       Social Drivers of Health (SDOH) Interventions SDOH Screenings   Food Insecurity: No Food Insecurity (04/24/2024)  Housing: Low Risk  (04/24/2024)  Transportation Needs: No Transportation Needs (04/24/2024)  Utilities: Not At Risk (04/24/2024)  Social Connections: Socially Isolated (04/24/2024)  Tobacco Use: Low Risk  (04/24/2024)     Readmission Risk Interventions     No data to display

## 2024-04-27 LAB — PARATHYROID HORMONE, INTACT (NO CA): PTH: 79 pg/mL — ABNORMAL HIGH (ref 15–65)

## 2024-05-24 DEATH — deceased
